# Patient Record
Sex: Male | Born: 1960 | Race: White | Hispanic: No | Marital: Single | Smoking: Former smoker
Health system: Southern US, Community
[De-identification: ages and names within clinical notes are randomized; demographics above are authoritative.]

## PROBLEM LIST (undated history)

## (undated) DIAGNOSIS — K219 Gastro-esophageal reflux disease without esophagitis: Secondary | ICD-10-CM

## (undated) DIAGNOSIS — I10 Essential (primary) hypertension: Secondary | ICD-10-CM

---

## 1997-08-22 ENCOUNTER — Emergency Department (HOSPITAL_COMMUNITY): Admission: EM | Admit: 1997-08-22 | Discharge: 1997-08-22 | Payer: Self-pay | Admitting: Emergency Medicine

## 1998-02-28 ENCOUNTER — Encounter: Payer: Self-pay | Admitting: Emergency Medicine

## 1998-02-28 ENCOUNTER — Emergency Department (HOSPITAL_COMMUNITY): Admission: EM | Admit: 1998-02-28 | Discharge: 1998-02-28 | Payer: Self-pay | Admitting: Emergency Medicine

## 1999-02-21 ENCOUNTER — Emergency Department (HOSPITAL_COMMUNITY): Admission: EM | Admit: 1999-02-21 | Discharge: 1999-02-21 | Payer: Self-pay | Admitting: Emergency Medicine

## 1999-11-25 ENCOUNTER — Emergency Department (HOSPITAL_COMMUNITY): Admission: EM | Admit: 1999-11-25 | Discharge: 1999-11-25 | Payer: Self-pay | Admitting: *Deleted

## 1999-11-25 ENCOUNTER — Encounter: Payer: Self-pay | Admitting: *Deleted

## 1999-12-27 ENCOUNTER — Encounter: Payer: Self-pay | Admitting: *Deleted

## 1999-12-27 ENCOUNTER — Emergency Department (HOSPITAL_COMMUNITY): Admission: EM | Admit: 1999-12-27 | Discharge: 1999-12-27 | Payer: Self-pay

## 1999-12-29 ENCOUNTER — Ambulatory Visit (HOSPITAL_BASED_OUTPATIENT_CLINIC_OR_DEPARTMENT_OTHER): Admission: RE | Admit: 1999-12-29 | Discharge: 1999-12-29 | Payer: Self-pay | Admitting: Orthopedic Surgery

## 2000-01-13 ENCOUNTER — Encounter: Admission: RE | Admit: 2000-01-13 | Discharge: 2000-04-12 | Payer: Self-pay | Admitting: Orthopedic Surgery

## 2000-03-02 ENCOUNTER — Emergency Department (HOSPITAL_COMMUNITY): Admission: EM | Admit: 2000-03-02 | Discharge: 2000-03-02 | Payer: Self-pay | Admitting: Emergency Medicine

## 2000-04-14 ENCOUNTER — Emergency Department (HOSPITAL_COMMUNITY): Admission: EM | Admit: 2000-04-14 | Discharge: 2000-04-14 | Payer: Self-pay | Admitting: Emergency Medicine

## 2000-04-14 ENCOUNTER — Encounter: Payer: Self-pay | Admitting: Emergency Medicine

## 2015-06-06 ENCOUNTER — Emergency Department (HOSPITAL_COMMUNITY): Payer: No Typology Code available for payment source | Admitting: Anesthesiology

## 2015-06-06 ENCOUNTER — Emergency Department (HOSPITAL_COMMUNITY): Payer: Self-pay | Admitting: Anesthesiology

## 2015-06-06 ENCOUNTER — Emergency Department (HOSPITAL_COMMUNITY): Payer: Self-pay

## 2015-06-06 ENCOUNTER — Inpatient Hospital Stay (HOSPITAL_COMMUNITY)
Admission: EM | Admit: 2015-06-06 | Discharge: 2015-06-09 | DRG: 494 | Disposition: A | Discharge status: Home / Self Care | Payer: Self-pay | Attending: Orthopedic Surgery | Admitting: Orthopedic Surgery

## 2015-06-06 ENCOUNTER — Encounter (HOSPITAL_COMMUNITY): Payer: Self-pay | Admitting: Emergency Medicine

## 2015-06-06 ENCOUNTER — Encounter (HOSPITAL_COMMUNITY)
Admission: EM | Disposition: A | Discharge status: Home / Self Care | Payer: Self-pay | Source: Home / Self Care | Attending: Orthopedic Surgery

## 2015-06-06 DIAGNOSIS — I10 Essential (primary) hypertension: Secondary | ICD-10-CM | POA: Diagnosis present

## 2015-06-06 DIAGNOSIS — Z09 Encounter for follow-up examination after completed treatment for conditions other than malignant neoplasm: Secondary | ICD-10-CM

## 2015-06-06 DIAGNOSIS — S82241A Displaced spiral fracture of shaft of right tibia, initial encounter for closed fracture: Principal | ICD-10-CM | POA: Diagnosis present

## 2015-06-06 DIAGNOSIS — S82291A Other fracture of shaft of right tibia, initial encounter for closed fracture: Secondary | ICD-10-CM

## 2015-06-06 DIAGNOSIS — T07XXXA Unspecified multiple injuries, initial encounter: Secondary | ICD-10-CM

## 2015-06-06 DIAGNOSIS — Z79899 Other long term (current) drug therapy: Secondary | ICD-10-CM

## 2015-06-06 DIAGNOSIS — S82201A Unspecified fracture of shaft of right tibia, initial encounter for closed fracture: Secondary | ICD-10-CM | POA: Diagnosis present

## 2015-06-06 DIAGNOSIS — N400 Enlarged prostate without lower urinary tract symptoms: Secondary | ICD-10-CM | POA: Diagnosis present

## 2015-06-06 DIAGNOSIS — Z419 Encounter for procedure for purposes other than remedying health state, unspecified: Secondary | ICD-10-CM

## 2015-06-06 DIAGNOSIS — S82401A Unspecified fracture of shaft of right fibula, initial encounter for closed fracture: Secondary | ICD-10-CM | POA: Diagnosis present

## 2015-06-06 DIAGNOSIS — Z888 Allergy status to other drugs, medicaments and biological substances status: Secondary | ICD-10-CM

## 2015-06-06 HISTORY — DX: Essential (primary) hypertension: I10

## 2015-06-06 HISTORY — PX: TIBIA IM NAIL INSERTION: SHX2516

## 2015-06-06 SURGERY — INSERTION, INTRAMEDULLARY ROD, TIBIA
Anesthesia: General | Laterality: Right

## 2015-06-06 MED ORDER — MORPHINE SULFATE (PF) 4 MG/ML IV SOLN
4.0000 mg | Freq: Once | INTRAVENOUS | Status: AC
  Administered 2015-06-06: 4 mg via INTRAVENOUS
  Filled 2015-06-06: qty 1

## 2015-06-06 MED ORDER — NEOSTIGMINE METHYLSULFATE 10 MG/10ML IV SOLN
INTRAVENOUS | Status: DC | PRN
  Administered 2015-06-06: 3 mg via INTRAVENOUS

## 2015-06-06 MED ORDER — ROCURONIUM BROMIDE 50 MG/5ML IV SOLN
INTRAVENOUS | Status: AC
  Filled 2015-06-06: qty 1

## 2015-06-06 MED ORDER — PROPOFOL 10 MG/ML IV BOLUS
INTRAVENOUS | Status: AC
  Filled 2015-06-06: qty 40

## 2015-06-06 MED ORDER — CEFAZOLIN SODIUM 1 G IJ SOLR
INTRAMUSCULAR | Status: AC
  Filled 2015-06-06: qty 20

## 2015-06-06 MED ORDER — PROPOFOL 10 MG/ML IV BOLUS
INTRAVENOUS | Status: DC | PRN
  Administered 2015-06-06: 200 mg via INTRAVENOUS

## 2015-06-06 MED ORDER — HYDROMORPHONE HCL 1 MG/ML IJ SOLN
1.0000 mg | Freq: Once | INTRAMUSCULAR | Status: AC
  Administered 2015-06-06: 1 mg via INTRAVENOUS
  Filled 2015-06-06: qty 1

## 2015-06-06 MED ORDER — PHENYLEPHRINE 40 MCG/ML (10ML) SYRINGE FOR IV PUSH (FOR BLOOD PRESSURE SUPPORT)
PREFILLED_SYRINGE | INTRAVENOUS | Status: AC
  Filled 2015-06-06: qty 10

## 2015-06-06 MED ORDER — EPHEDRINE SULFATE 50 MG/ML IJ SOLN
INTRAMUSCULAR | Status: DC | PRN
  Administered 2015-06-06: 10 mg via INTRAVENOUS

## 2015-06-06 MED ORDER — HYDROMORPHONE HCL 1 MG/ML IJ SOLN
INTRAMUSCULAR | Status: AC
  Filled 2015-06-06: qty 1

## 2015-06-06 MED ORDER — GLYCOPYRROLATE 0.2 MG/ML IV SOSY
PREFILLED_SYRINGE | INTRAVENOUS | Status: AC
  Filled 2015-06-06: qty 6

## 2015-06-06 MED ORDER — SUCCINYLCHOLINE CHLORIDE 200 MG/10ML IV SOSY
PREFILLED_SYRINGE | INTRAVENOUS | Status: AC
  Filled 2015-06-06: qty 10

## 2015-06-06 MED ORDER — DIPHENHYDRAMINE HCL 50 MG/ML IJ SOLN
INTRAMUSCULAR | Status: AC
  Filled 2015-06-06: qty 1

## 2015-06-06 MED ORDER — EPHEDRINE 5 MG/ML INJ
INTRAVENOUS | Status: AC
  Filled 2015-06-06: qty 10

## 2015-06-06 MED ORDER — MIDAZOLAM HCL 5 MG/5ML IJ SOLN
INTRAMUSCULAR | Status: DC | PRN
  Administered 2015-06-06: 2 mg via INTRAVENOUS

## 2015-06-06 MED ORDER — 0.9 % SODIUM CHLORIDE (POUR BTL) OPTIME
TOPICAL | Status: DC | PRN
  Administered 2015-06-06 (×3): 1000 mL

## 2015-06-06 MED ORDER — LIDOCAINE 2% (20 MG/ML) 5 ML SYRINGE
INTRAMUSCULAR | Status: AC
  Filled 2015-06-06: qty 5

## 2015-06-06 MED ORDER — ROCURONIUM BROMIDE 100 MG/10ML IV SOLN
INTRAVENOUS | Status: DC | PRN
  Administered 2015-06-06: 10 mg via INTRAVENOUS
  Administered 2015-06-06: 20 mg via INTRAVENOUS

## 2015-06-06 MED ORDER — NEOSTIGMINE METHYLSULFATE 5 MG/5ML IV SOSY
PREFILLED_SYRINGE | INTRAVENOUS | Status: AC
  Filled 2015-06-06: qty 5

## 2015-06-06 MED ORDER — GLYCOPYRROLATE 0.2 MG/ML IJ SOLN
INTRAMUSCULAR | Status: DC | PRN
  Administered 2015-06-06: 0.2 mg via INTRAVENOUS

## 2015-06-06 MED ORDER — TETANUS-DIPHTH-ACELL PERTUSSIS 5-2.5-18.5 LF-MCG/0.5 IM SUSP
0.5000 mL | Freq: Once | INTRAMUSCULAR | Status: AC
  Administered 2015-06-06: 0.5 mL via INTRAMUSCULAR
  Filled 2015-06-06: qty 0.5

## 2015-06-06 MED ORDER — FENTANYL CITRATE (PF) 250 MCG/5ML IJ SOLN
INTRAMUSCULAR | Status: DC | PRN
  Administered 2015-06-06 (×3): 50 ug via INTRAVENOUS
  Administered 2015-06-06: 100 ug via INTRAVENOUS

## 2015-06-06 MED ORDER — PHENYLEPHRINE HCL 10 MG/ML IJ SOLN
INTRAMUSCULAR | Status: DC | PRN
  Administered 2015-06-06: 80 ug via INTRAVENOUS

## 2015-06-06 MED ORDER — SUCCINYLCHOLINE CHLORIDE 20 MG/ML IJ SOLN
INTRAMUSCULAR | Status: DC | PRN
Start: 2015-06-06 — End: 2015-06-07
  Administered 2015-06-06: 100 mg via INTRAVENOUS

## 2015-06-06 MED ORDER — LACTATED RINGERS IV SOLN
INTRAVENOUS | Status: DC | PRN
  Administered 2015-06-06 (×2): via INTRAVENOUS

## 2015-06-06 MED ORDER — ETOMIDATE 2 MG/ML IV SOLN
INTRAVENOUS | Status: AC
  Filled 2015-06-06: qty 10

## 2015-06-06 MED ORDER — ONDANSETRON HCL 4 MG/2ML IJ SOLN
INTRAMUSCULAR | Status: DC | PRN
  Administered 2015-06-06: 4 mg via INTRAVENOUS

## 2015-06-06 MED ORDER — MIDAZOLAM HCL 2 MG/2ML IJ SOLN
INTRAMUSCULAR | Status: AC
  Filled 2015-06-06: qty 2

## 2015-06-06 MED ORDER — LIDOCAINE HCL (CARDIAC) 20 MG/ML IV SOLN
INTRAVENOUS | Status: DC | PRN
  Administered 2015-06-06: 50 mg via INTRATRACHEAL

## 2015-06-06 MED ORDER — BUPIVACAINE-EPINEPHRINE (PF) 0.5% -1:200000 IJ SOLN
INTRAMUSCULAR | Status: AC
  Filled 2015-06-06: qty 30

## 2015-06-06 MED ORDER — CEFAZOLIN SODIUM-DEXTROSE 2-3 GM-% IV SOLR
INTRAVENOUS | Status: DC | PRN
  Administered 2015-06-06: 2 g via INTRAVENOUS

## 2015-06-06 MED ORDER — FENTANYL CITRATE (PF) 250 MCG/5ML IJ SOLN
INTRAMUSCULAR | Status: AC
  Filled 2015-06-06: qty 5

## 2015-06-06 SURGICAL SUPPLY — 77 items
BANDAGE ELASTIC 4 VELCRO ST LF (GAUZE/BANDAGES/DRESSINGS) ×2 IMPLANT
BANDAGE ELASTIC 6 VELCRO ST LF (GAUZE/BANDAGES/DRESSINGS) ×2 IMPLANT
BIT DRILL 3.8X6 NS (BIT) ×2 IMPLANT
BIT DRILL 4.4 NS (BIT) ×2 IMPLANT
BLADE SURG 10 STRL SS (BLADE) ×4 IMPLANT
BNDG COHESIVE 4X5 TAN STRL (GAUZE/BANDAGES/DRESSINGS) ×2 IMPLANT
BNDG GAUZE ELAST 4 BULKY (GAUZE/BANDAGES/DRESSINGS) ×2 IMPLANT
COTTON STERILE ROLL (GAUZE/BANDAGES/DRESSINGS) ×2 IMPLANT
COVER MAYO STAND STRL (DRAPES) ×2 IMPLANT
COVER SURGICAL LIGHT HANDLE (MISCELLANEOUS) ×2 IMPLANT
CUFF TOURNIQUET SINGLE 34IN LL (TOURNIQUET CUFF) IMPLANT
CUFF TOURNIQUET SINGLE 44IN (TOURNIQUET CUFF) IMPLANT
DECANTER SPIKE VIAL GLASS SM (MISCELLANEOUS) ×2 IMPLANT
DRAPE C-ARM 42X72 X-RAY (DRAPES) ×2 IMPLANT
DRAPE C-ARMOR (DRAPES) ×2 IMPLANT
DRAPE IMP U-DRAPE 54X76 (DRAPES) ×2 IMPLANT
DRAPE INCISE IOBAN 66X45 STRL (DRAPES) IMPLANT
DRAPE ORTHO SPLIT 77X108 STRL (DRAPES) ×1
DRAPE PROXIMA HALF (DRAPES) ×2 IMPLANT
DRAPE SURG ORHT 6 SPLT 77X108 (DRAPES) ×1 IMPLANT
DRESSING OPSITE X SMALL 2X3 (GAUZE/BANDAGES/DRESSINGS) ×2 IMPLANT
DRSG PAD ABDOMINAL 8X10 ST (GAUZE/BANDAGES/DRESSINGS) ×2 IMPLANT
DURAPREP 26ML APPLICATOR (WOUND CARE) ×2 IMPLANT
ELECT REM PT RETURN 9FT ADLT (ELECTROSURGICAL) ×2
ELECTRODE REM PT RTRN 9FT ADLT (ELECTROSURGICAL) ×1 IMPLANT
GAUZE SPONGE 4X4 12PLY STRL (GAUZE/BANDAGES/DRESSINGS) ×2 IMPLANT
GAUZE XEROFORM 1X8 LF (GAUZE/BANDAGES/DRESSINGS) ×2 IMPLANT
GAUZE XEROFORM 5X9 LF (GAUZE/BANDAGES/DRESSINGS) ×2 IMPLANT
GLOVE BIO SURGEON STRL SZ8 (GLOVE) ×4 IMPLANT
GLOVE BIO SURGEON STRL SZ8.5 (GLOVE) ×6 IMPLANT
GLOVE BIOGEL PI IND STRL 8 (GLOVE) ×1 IMPLANT
GLOVE BIOGEL PI IND STRL 8.5 (GLOVE) ×2 IMPLANT
GLOVE BIOGEL PI INDICATOR 8 (GLOVE) ×1
GLOVE BIOGEL PI INDICATOR 8.5 (GLOVE) ×2
GLOVE ORTHO TXT STRL SZ7.5 (GLOVE) ×2 IMPLANT
GOWN STRL REUS W/ TWL LRG LVL3 (GOWN DISPOSABLE) ×1 IMPLANT
GOWN STRL REUS W/ TWL XL LVL3 (GOWN DISPOSABLE) ×4 IMPLANT
GOWN STRL REUS W/TWL LRG LVL3 (GOWN DISPOSABLE) ×1
GOWN STRL REUS W/TWL XL LVL3 (GOWN DISPOSABLE) ×4
GUIDEWIRE BALL NOSE 80CM (WIRE) ×2 IMPLANT
IMMOBILIZER KNEE 22 (SOFTGOODS) ×2 IMPLANT
KIT BASIN OR (CUSTOM PROCEDURE TRAY) ×2 IMPLANT
KIT ROOM TURNOVER OR (KITS) ×2 IMPLANT
MANIFOLD NEPTUNE II (INSTRUMENTS) IMPLANT
NAIL TIBIAL 11MMX34.5CM (Nail) ×2 IMPLANT
NEEDLE 22X1 1/2 (OR ONLY) (NEEDLE) ×2 IMPLANT
NEEDLE HYPO 21X1.5 SAFETY (NEEDLE) ×2 IMPLANT
NS IRRIG 1000ML POUR BTL (IV SOLUTION) ×6 IMPLANT
PACK ORTHO EXTREMITY (CUSTOM PROCEDURE TRAY) ×2 IMPLANT
PACK UNIVERSAL I (CUSTOM PROCEDURE TRAY) ×2 IMPLANT
PAD ABD 8X10 STRL (GAUZE/BANDAGES/DRESSINGS) ×2 IMPLANT
PAD ARMBOARD 7.5X6 YLW CONV (MISCELLANEOUS) ×4 IMPLANT
PAD CAST 4YDX4 CTTN HI CHSV (CAST SUPPLIES) ×1 IMPLANT
PADDING CAST COTTON 4X4 STRL (CAST SUPPLIES) ×1
PIN GUIDE 3.2 903003004 (MISCELLANEOUS) ×2 IMPLANT
SCREW ACECAP 38MM (Screw) ×4 IMPLANT
SCREW ACECAP 40MM (Screw) ×4 IMPLANT
SCREW ACECAP 52MM (Screw) ×2 IMPLANT
SCREW CORTICAL 5.5 35MM (Screw) ×2 IMPLANT
SCREW PROXIMAL DEPUY (Screw) ×1 IMPLANT
SCREW PRXML FT 50X5.5XLCK NS (Screw) ×1 IMPLANT
SPONGE GAUZE 4X4 12PLY STER LF (GAUZE/BANDAGES/DRESSINGS) ×2 IMPLANT
SPONGE LAP 18X18 X RAY DECT (DISPOSABLE) ×2 IMPLANT
SPONGE LAP 4X18 X RAY DECT (DISPOSABLE) ×2 IMPLANT
STAPLER VISISTAT 35W (STAPLE) ×2 IMPLANT
STOCKINETTE IMPERVIOUS LG (DRAPES) ×2 IMPLANT
SUT VIC AB 0 CTB1 27 (SUTURE) ×2 IMPLANT
SUT VIC AB 1 CT1 27 (SUTURE) ×2
SUT VIC AB 1 CT1 27XBRD ANBCTR (SUTURE) ×2 IMPLANT
SUT VIC AB 2-0 CT1 27 (SUTURE) ×2
SUT VIC AB 2-0 CT1 TAPERPNT 27 (SUTURE) ×2 IMPLANT
SYR CONTROL 10ML LL (SYRINGE) ×2 IMPLANT
TOWEL OR 17X24 6PK STRL BLUE (TOWEL DISPOSABLE) ×2 IMPLANT
TOWEL OR 17X26 10 PK STRL BLUE (TOWEL DISPOSABLE) ×2 IMPLANT
TUBE CONNECTING 12X1/4 (SUCTIONS) ×2 IMPLANT
WATER STERILE IRR 1000ML POUR (IV SOLUTION) ×2 IMPLANT
YANKAUER SUCT BULB TIP NO VENT (SUCTIONS) ×2 IMPLANT
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
## 2015-06-06 NOTE — ED Notes (Signed)
Ortho at bedside.

## 2015-06-06 NOTE — Anesthesia Procedure Notes (Signed)
Procedure Name: Intubation Date/Time: 06/06/2015 9:52 PM Performed by: Brien MatesMAHONY, Meena Barrantes D Pre-anesthesia Checklist: Patient identified, Emergency Drugs available, Suction available, Patient being monitored and Timeout performed Patient Re-evaluated:Patient Re-evaluated prior to inductionOxygen Delivery Method: Circle system utilized Preoxygenation: Pre-oxygenation with 100% oxygen Intubation Type: IV induction, Rapid sequence and Cricoid Pressure applied Laryngoscope Size: Miller and 2 Grade View: Grade I Tube type: Oral Tube size: 7.5 mm Number of attempts: 1 Airway Equipment and Method: Stylet Placement Confirmation: ETT inserted through vocal cords under direct vision,  positive ETCO2 and breath sounds checked- equal and bilateral Secured at: 22 cm Tube secured with: Tape Dental Injury: Teeth and Oropharynx as per pre-operative assessment

## 2015-06-06 NOTE — Anesthesia Preprocedure Evaluation (Addendum)
Anesthesia Evaluation  Patient identified by MRN, date of birth, ID band Patient awake    Reviewed: Allergy & Precautions, NPO status , Patient's Chart, lab work & pertinent test results  Airway Mallampati: I  TM Distance: >3 FB Neck ROM: Full    Dental  (+) Poor Dentition, Dental Advisory Given   Pulmonary    Pulmonary exam normal        Cardiovascular hypertension, Normal cardiovascular exam     Neuro/Psych    GI/Hepatic   Endo/Other    Renal/GU      Musculoskeletal   Abdominal   Peds  Hematology   Anesthesia Other Findings   Reproductive/Obstetrics                            Anesthesia Physical Anesthesia Plan  ASA: I and emergent  Anesthesia Plan: General   Post-op Pain Management:    Induction: Intravenous  Airway Management Planned: Oral ETT  Additional Equipment:   Intra-op Plan:   Post-operative Plan: Extubation in OR  Informed Consent: I have reviewed the patients History and Physical, chart, labs and discussed the procedure including the risks, benefits and alternatives for the proposed anesthesia with the patient or authorized representative who has indicated his/her understanding and acceptance.   Dental advisory given  Plan Discussed with: CRNA, Anesthesiologist and Surgeon  Anesthesia Plan Comments:        Anesthesia Quick Evaluation

## 2015-06-06 NOTE — Progress Notes (Signed)
Orthopedic Tech Progress Note Patient Details:  Ryan Finley [birth year] [id number]  Ortho Devices Type of Ortho Device: Ace wrap, Post (long leg) splint, Stirrup splint Ortho Device/Splint Location: RLE Ortho Device/Splint Interventions: Ordered, Application   Jennye MoccasinHughes, [name] Craig 06/06/2015, 7:29 PM

## 2015-06-06 NOTE — Brief Op Note (Signed)
06/06/2015  11:36 PM  PATIENT:  Clide CliffJohn M Skilling  55 y.o. male  PRE-OPERATIVE DIAGNOSIS:  RIGHT TIBIA FRACTURE  POST-OPERATIVE DIAGNOSIS:  RIGHT TIBIA FRACTURE  PROCEDURE:  Procedure(s): INTRAMEDULLARY (IM) NAIL TIBIAL (Right)  SURGEON:  Surgeon(s) and Role:    * Samson FredericBrian Candice Lunney, MD - Primary  PHYSICIAN ASSISTANT: Alphonsa OverallBrad Dixon, PA-C  ASSISTANTS: staff   ANESTHESIA:   general  EBL:  Total I/O In: 1700 [I.V.:1700] Out: 150 [Blood:150]  BLOOD ADMINISTERED:none  DRAINS: none   LOCAL MEDICATIONS USED:  NONE  SPECIMEN:  No Specimen  DISPOSITION OF SPECIMEN:  N/A  COUNTS:  YES  TOURNIQUET:  * No tourniquets in log *  DICTATION: .Other Dictation: Dictation Number [id number]  PLAN OF CARE: Admit to inpatient   PATIENT DISPOSITION:  PACU - hemodynamically stable.   Delay start of Pharmacological VTE agent (>24hrs) due to surgical blood loss or risk of bleeding: no

## 2015-06-07 ENCOUNTER — Inpatient Hospital Stay (HOSPITAL_COMMUNITY): Payer: No Typology Code available for payment source

## 2015-06-07 DIAGNOSIS — S82201A Unspecified fracture of shaft of right tibia, initial encounter for closed fracture: Secondary | ICD-10-CM | POA: Diagnosis present

## 2015-06-07 LAB — CBC
HCT: 38 % — ABNORMAL LOW (ref 39.0–52.0)
HEMOGLOBIN: 13.2 g/dL (ref 13.0–17.0)
MCH: 30.1 pg (ref 26.0–34.0)
MCHC: 34.7 g/dL (ref 30.0–36.0)
MCV: 86.6 fL (ref 78.0–100.0)
PLATELETS: 206 10*3/uL (ref 150–400)
RBC: 4.39 MIL/uL (ref 4.22–5.81)
RDW: 12.8 % (ref 11.5–15.5)
WBC: 13.6 10*3/uL — ABNORMAL HIGH (ref 4.0–10.5)

## 2015-06-07 LAB — CREATININE, SERUM
CREATININE: 1.04 mg/dL (ref 0.61–1.24)
GFR calc Af Amer: >60 mL/min (ref >60)
GFR calc non Af Amer: >60 mL/min (ref >60)

## 2015-06-07 MED ORDER — ONDANSETRON HCL 4 MG/2ML IJ SOLN
4.0000 mg | Freq: Four times a day (QID) | INTRAMUSCULAR | Status: DC | PRN
  Administered 2015-06-07 – 2015-06-08 (×2): 4 mg via INTRAVENOUS
  Filled 2015-06-07 (×2): qty 2

## 2015-06-07 MED ORDER — HYDROMORPHONE HCL 1 MG/ML IJ SOLN
INTRAMUSCULAR | Status: AC
  Filled 2015-06-07: qty 1

## 2015-06-07 MED ORDER — HYDROMORPHONE HCL 1 MG/ML IJ SOLN
0.5000 mg | INTRAMUSCULAR | Status: DC | PRN
  Administered 2015-06-07: 1 mg via INTRAVENOUS
  Administered 2015-06-07: 2 mg via INTRAVENOUS
  Administered 2015-06-07: 1 mg via INTRAVENOUS
  Administered 2015-06-07 (×2): 2 mg via INTRAVENOUS
  Administered 2015-06-07: 1 mg via INTRAVENOUS
  Administered 2015-06-07 (×2): 2 mg via INTRAVENOUS
  Administered 2015-06-07: 1 mg via INTRAVENOUS
  Administered 2015-06-07 – 2015-06-08 (×5): 2 mg via INTRAVENOUS
  Filled 2015-06-07: qty 1
  Filled 2015-06-07 (×5): qty 2
  Filled 2015-06-07: qty 1
  Filled 2015-06-07: qty 2
  Filled 2015-06-07: qty 1
  Filled 2015-06-07: qty 2
  Filled 2015-06-07: qty 1
  Filled 2015-06-07 (×3): qty 2

## 2015-06-07 MED ORDER — METHOCARBAMOL 500 MG PO TABS
500.0000 mg | ORAL_TABLET | Freq: Three times a day (TID) | ORAL | Status: DC | PRN
  Administered 2015-06-07 – 2015-06-09 (×6): 500 mg via ORAL
  Filled 2015-06-07 (×6): qty 1

## 2015-06-07 MED ORDER — OXYCODONE HCL 5 MG PO TABS
5.0000 mg | ORAL_TABLET | ORAL | Status: DC | PRN
  Administered 2015-06-07 – 2015-06-08 (×4): 10 mg via ORAL
  Filled 2015-06-07 (×5): qty 2

## 2015-06-07 MED ORDER — ENOXAPARIN SODIUM 40 MG/0.4ML ~~LOC~~ SOLN
40.0000 mg | SUBCUTANEOUS | Status: DC
  Administered 2015-06-07 – 2015-06-09 (×3): 40 mg via SUBCUTANEOUS
  Filled 2015-06-07 (×3): qty 0.4

## 2015-06-07 MED ORDER — ONDANSETRON HCL 4 MG/2ML IJ SOLN
4.0000 mg | Freq: Once | INTRAMUSCULAR | Status: AC | PRN
  Administered 2015-06-07: 4 mg via INTRAVENOUS

## 2015-06-07 MED ORDER — METOCLOPRAMIDE HCL 5 MG PO TABS
5.0000 mg | ORAL_TABLET | Freq: Three times a day (TID) | ORAL | Status: DC | PRN
Start: 2015-06-07 — End: 2015-06-09

## 2015-06-07 MED ORDER — DOCUSATE SODIUM 100 MG PO CAPS
100.0000 mg | ORAL_CAPSULE | Freq: Two times a day (BID) | ORAL | Status: DC
  Administered 2015-06-07 – 2015-06-09 (×5): 100 mg via ORAL
  Filled 2015-06-07 (×5): qty 1

## 2015-06-07 MED ORDER — ONDANSETRON HCL 4 MG/2ML IJ SOLN
INTRAMUSCULAR | Status: AC
  Filled 2015-06-07: qty 2

## 2015-06-07 MED ORDER — ACETAMINOPHEN 500 MG PO TABS
1000.0000 mg | ORAL_TABLET | Freq: Four times a day (QID) | ORAL | Status: AC
  Administered 2015-06-07 – 2015-06-08 (×4): 1000 mg via ORAL
  Filled 2015-06-07 (×5): qty 2

## 2015-06-07 MED ORDER — ACETAMINOPHEN 325 MG PO TABS
650.0000 mg | ORAL_TABLET | Freq: Four times a day (QID) | ORAL | Status: DC | PRN
  Filled 2015-06-07 (×2): qty 2

## 2015-06-07 MED ORDER — MAGNESIUM CITRATE PO SOLN
1.0000 | Freq: Once | ORAL | Status: AC | PRN
  Administered 2015-06-09: 1 via ORAL
  Filled 2015-06-07: qty 296

## 2015-06-07 MED ORDER — HYDROMORPHONE HCL 1 MG/ML IJ SOLN
0.5000 mg | INTRAMUSCULAR | Status: AC | PRN
  Administered 2015-06-07 (×4): 0.5 mg via INTRAVENOUS

## 2015-06-07 MED ORDER — SENNA 8.6 MG PO TABS
1.0000 | ORAL_TABLET | Freq: Two times a day (BID) | ORAL | Status: DC
  Administered 2015-06-07 – 2015-06-09 (×5): 8.6 mg via ORAL
  Filled 2015-06-07 (×5): qty 1

## 2015-06-07 MED ORDER — LORAZEPAM 0.5 MG PO TABS
0.5000 mg | ORAL_TABLET | Freq: Once | ORAL | Status: AC | PRN
  Administered 2015-06-07: 0.5 mg via ORAL
  Filled 2015-06-07: qty 1

## 2015-06-07 MED ORDER — PANTOPRAZOLE SODIUM 40 MG PO TBEC
40.0000 mg | DELAYED_RELEASE_TABLET | Freq: Every day | ORAL | Status: DC
  Administered 2015-06-07 – 2015-06-09 (×3): 40 mg via ORAL
  Filled 2015-06-07 (×3): qty 1

## 2015-06-07 MED ORDER — ONDANSETRON HCL 4 MG PO TABS: 4.0000 mg | ORAL_TABLET | Freq: Four times a day (QID) | ORAL | Status: DC | PRN

## 2015-06-07 MED ORDER — SODIUM CHLORIDE 0.9 % IV SOLN
INTRAVENOUS | Status: DC
  Administered 2015-06-07 – 2015-06-08 (×3): via INTRAVENOUS

## 2015-06-07 MED ORDER — HYDROMORPHONE HCL 1 MG/ML IJ SOLN
0.2500 mg | INTRAMUSCULAR | Status: DC | PRN
  Administered 2015-06-07 (×2): 0.5 mg via INTRAVENOUS

## 2015-06-07 MED ORDER — CEFAZOLIN SODIUM-DEXTROSE 2-4 GM/100ML-% IV SOLN
2.0000 g | Freq: Four times a day (QID) | INTRAVENOUS | Status: AC
  Administered 2015-06-07 (×3): 2 g via INTRAVENOUS
  Filled 2015-06-07 (×3): qty 100

## 2015-06-07 MED ORDER — SORBITOL 70 % SOLN
30.0000 mL | Freq: Every day | Status: DC | PRN
  Administered 2015-06-09: 30 mL via ORAL
  Filled 2015-06-07: qty 30

## 2015-06-07 MED ORDER — TERAZOSIN HCL 5 MG PO CAPS
5.0000 mg | ORAL_CAPSULE | Freq: Every day | ORAL | Status: DC
  Administered 2015-06-07 – 2015-06-08 (×2): 5 mg via ORAL
  Filled 2015-06-07 (×2): qty 1

## 2015-06-07 MED ORDER — METOCLOPRAMIDE HCL 5 MG/ML IJ SOLN
5.0000 mg | Freq: Three times a day (TID) | INTRAMUSCULAR | Status: DC | PRN
  Administered 2015-06-08: 10 mg via INTRAVENOUS
  Filled 2015-06-07: qty 2

## 2015-06-07 MED ORDER — ACETAMINOPHEN 650 MG RE SUPP: 650.0000 mg | Freq: Four times a day (QID) | RECTAL | Status: DC | PRN

## 2015-06-07 NOTE — Progress Notes (Signed)
Patient ID: Ryan Finley, male   DOB: 03/13/1960, 55 y.o.   MRN: [id number]    Subjective: 1 Day Post-Op Procedure(s) (LRB): INTRAMEDULLARY (IM) NAIL TIBIAL (Right) Patient reports pain as 7 on 0-10 scale.   Denies CP or SOB.  Pt is not voiding yet. Positive flatus. Pt eating, no nausea Objective: Vital signs in last 24 hours: Temp:  [97.4 F (36.3 C)-98.6 F (37 C)] 98.6 F (37 C) (05/14 0825) Pulse Rate:  [77-103] 103 (05/14 0825) Resp:  [11-22] 14 (05/14 0825) BP: (125-172)/(51-102) 149/80 mmHg (05/14 0825) SpO2:  [90 %-100 %] 95 % (05/14 0825) Weight:  [86.183 kg (190 lb)] 86.183 kg (190 lb) (05/13 1828)  Intake/Output from previous day: 05/13 0701 - 05/14 0700 In: 2060 [P.O.:360; I.V.:1700] Out: 500 [Urine:350; Blood:150] Intake/Output this shift: Total I/O In: 240 [P.O.:240] Out: -   Labs:  Recent Labs  06/07/15 0100  HGB 13.2    Recent Labs  06/07/15 0100  WBC 13.6*  RBC 4.39  HCT 38.0*  PLT 206    Recent Labs  06/07/15 0100  CREATININE 1.04   No results for input(s): LABPT, INR in the last 72 hours.  Physical Exam: Neurologically intact ABD soft Neurovascular intact Incision: dressing C/D/I Compartment soft Pt moving toes b/l Flexion of 1st metatarsal does not elicit pain  Assessment/Plan: 1 Day Post-Op Procedure(s) (LRB): INTRAMEDULLARY (IM) NAIL TIBIAL (Right) Advance diet Up with therapy  Non weight bearing of right lower extremity Bladder scan and in/out cath may be needed again  Kirt BoysMayo, Taedyn Glasscock Christina for Dr. Venita Lickahari Brooks [facility] [phone] 06/07/2015, 8:35 AM

## 2015-06-07 NOTE — Transfer of Care (Signed)
Immediate Anesthesia Transfer of Care Note  Patient: Ryan Finley  Procedure(s) Performed: Procedure(s): INTRAMEDULLARY (IM) NAIL TIBIAL (Right)  Patient Location: PACU  Anesthesia Type:General  Level of Consciousness: awake  Airway & Oxygen Therapy: Patient Spontanous Breathing  Post-op Assessment: Report given to RN and Post -op Vital signs reviewed and stable  Post vital signs: Reviewed and stable  Last Vitals:  Filed Vitals:   06/06/15 1930 06/06/15 2000  BP: 146/94 148/102  Pulse: 87 88  Temp:    Resp: 16 16    Last Pain:  Filed Vitals:   06/06/15 2134  PainSc: 8          Complications: No apparent anesthesia complications

## 2015-06-07 NOTE — Progress Notes (Signed)
PT Cancellation Note  Patient Details Name: Ryan CliffJohn M Archambault MRN: [id number] DOB: [birth year]   Cancelled Treatment:    Reason Eval/Treat Not Completed: Pain limiting ability to participate - pt unable to urinate, feels extreme pressure/discomfort at bladder and declines mobility evaluation at this time.  Reports up to EOB earlier this date. Will reattempt evaluation and make recommendations when pt willing to participate.     Narda AmberMartin, Brinleigh Tew [facility] 06/07/2015, 2:10 PM

## 2015-06-07 NOTE — Progress Notes (Signed)
D; unable to Void, Bladder scan done, 400 ml , notified MD, and I&O cath order received. I&O cath done, cloudy ambercolored urin, obtained

## 2015-06-07 NOTE — Anesthesia Postprocedure Evaluation (Signed)
Anesthesia Post Note  Patient: Ryan Finley  Procedure(s) Performed: Procedure(s) (LRB): INTRAMEDULLARY (IM) NAIL TIBIAL (Right)  Patient location during evaluation: PACU Level of consciousness: awake, oriented and patient cooperative Pain management: pain level not controlled Vital Signs Assessment: post-procedure vital signs reviewed and stable Respiratory status: spontaneous breathing and respiratory function stable Cardiovascular status: stable Anesthetic complications: no    Last Vitals:  Filed Vitals:   06/06/15 1930 06/06/15 2000  BP: 146/94 148/102  Pulse: 87 88  Temp:    Resp: 16 16    Last Pain:  Filed Vitals:   06/06/15 2134  PainSc: 8                  Kyden Potash EDWARD
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
## 2015-06-08 ENCOUNTER — Encounter (HOSPITAL_COMMUNITY): Payer: Self-pay | Admitting: Orthopedic Surgery

## 2015-06-08 MED ORDER — OXYCODONE HCL 5 MG PO TABS
10.0000 mg | ORAL_TABLET | ORAL | Status: DC | PRN
  Administered 2015-06-08 – 2015-06-09 (×8): 15 mg via ORAL
  Filled 2015-06-08 (×8): qty 3

## 2015-06-08 NOTE — Progress Notes (Signed)
   Subjective:  Patient reports pain as moderate.  BPH meds restarted - able to void.  Objective:   VITALS:   Filed Vitals:   06/07/15 0825 06/07/15 1538 06/07/15 2245 06/08/15 0455  BP: 149/80 139/88 128/83 134/77  Pulse: 103 103 103 104  Temp: 98.6 F (37 C) 99 F (37.2 C) 98.1 F (36.7 C) 98.3 F (36.8 C)  TempSrc: Oral Oral Oral Oral  Resp: 14 16 16 16   Height:      Weight:      SpO2: 95% 96% 91% 94%    ABD soft Intact pulses distally Dorsiflexion/Plantar flexion intact Incision: dressing C/D/I Compartment soft No pain with passive stretch No evidence of impending compartment syndrome  Lab Results  Component Value Date   WBC 13.6* 06/07/2015   HGB 13.2 06/07/2015   HCT 38.0* 06/07/2015   MCV 86.6 06/07/2015   PLT 206 06/07/2015   BMET    Component Value Date/Time   CREATININE 1.04 06/07/2015 0100   GFRNONAA >60 06/07/2015 0100   GFRAA >60 06/07/2015 0100     Assessment/Plan: 2 Days Post-Op   Active Problems:   Closed right tibial fracture  NWB RLE DVT ppx: lovenox, SCDs, TEDs PO pain control PT/OT D/C planning   Elvena Oyer, Cloyde ReamsBrian James 06/08/2015, 7:55 AM   Samson FredericBrian Anel Purohit, MD Cell [phone]
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
## 2015-06-08 NOTE — Progress Notes (Signed)
OT Cancellation Note  Patient Details Name: Ryan Finley MRN: [id number] DOB: September 07, 1960   Cancelled Treatment:    Reason Eval/Treat Not Completed: Other (comment). In to see pt for OT evaluation. He reports he would like at least two hours of good sleep. That he got up earlier this morning with PT and then got back to bed by himself and since both of these his knee has been throbbing and has just now calmed down as well as he said he threw up earlier while was in recliner. He states he feels like he is being pushed to do things to soon. Made him aware we may or may not be able to see him later today and if not would see him tomorrow--he verbalized understanding.  Ryan GeorgesLeonard, Ryan Finley Ryan Finley [phone] 06/08/2015, 11:43 AM

## 2015-06-09 MED ORDER — OXYCODONE HCL 5 MG PO TABS: 10.0000 mg | ORAL_TABLET | ORAL | Status: DC | PRN

## 2015-06-09 MED ORDER — ASPIRIN EC 325 MG PO TBEC: 325.0000 mg | DELAYED_RELEASE_TABLET | Freq: Two times a day (BID) | ORAL | Status: DC

## 2015-06-09 MED ORDER — SENNA 8.6 MG PO TABS: 2.0000 | ORAL_TABLET | Freq: Every day | ORAL | Status: DC

## 2015-06-09 MED ORDER — DOCUSATE SODIUM 100 MG PO CAPS: 100.0000 mg | ORAL_CAPSULE | Freq: Two times a day (BID) | ORAL | Status: DC

## 2015-06-09 MED ORDER — ONDANSETRON HCL 4 MG PO TABS: 4.0000 mg | ORAL_TABLET | Freq: Four times a day (QID) | ORAL | Status: DC | PRN
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
## 2015-06-09 NOTE — Discharge Instructions (Signed)
Non weight bearing right leg Elevate right ankle to level of your heart on blankets / pillows Leave dressing in place. Keep it clean and dry
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
## 2015-06-09 NOTE — Progress Notes (Signed)
   Subjective:  Patient reports pain as moderate.  Feeling better this am.  Objective:   VITALS:   Filed Vitals:   06/08/15 1245 06/08/15 1956 06/08/15 2200 06/09/15 0451  BP: 139/79 150/84  136/77  Pulse: 105 120 120 114  Temp: 98.9 F (37.2 C) 100 F (37.8 C)  98.7 F (37.1 C)  TempSrc: Oral Oral  Oral  Resp: 15 18  19   Height:      Weight:      SpO2: 90% 93%  96%    ABD soft Intact pulses distally Dorsiflexion/Plantar flexion intact Incision: dressing C/D/I Compartment soft No pain with passive stretch No evidence of impending compartment syndrome  Lab Results  Component Value Date   WBC 13.6* 06/07/2015   HGB 13.2 06/07/2015   HCT 38.0* 06/07/2015   MCV 86.6 06/07/2015   PLT 206 06/07/2015   BMET    Component Value Date/Time   CREATININE 1.04 06/07/2015 0100   GFRNONAA >60 06/07/2015 0100   GFRAA >60 06/07/2015 0100     Assessment/Plan: 3 Days Post-Op   Active Problems:   Closed right tibial fracture  NWB RLE DVT ppx: lovenox, SCDs, TEDs PO pain control PT/OT D/C planning   Sartaj Hoskin, Cloyde ReamsBrian James 06/09/2015, 7:55 AM   Samson FredericBrian Marchele Decock, MD Cell [phone]
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
## 2015-06-29 ENCOUNTER — Ambulatory Visit: Payer: Self-pay | Attending: Orthopedic Surgery | Admitting: Physical Therapy

## 2015-06-29 DIAGNOSIS — M25671 Stiffness of right ankle, not elsewhere classified: Secondary | ICD-10-CM | POA: Insufficient documentation

## 2015-06-29 DIAGNOSIS — M25571 Pain in right ankle and joints of right foot: Secondary | ICD-10-CM | POA: Insufficient documentation

## 2015-06-29 DIAGNOSIS — M25661 Stiffness of right knee, not elsewhere classified: Secondary | ICD-10-CM | POA: Insufficient documentation

## 2015-06-29 DIAGNOSIS — R609 Edema, unspecified: Secondary | ICD-10-CM | POA: Insufficient documentation

## 2015-06-29 DIAGNOSIS — M6281 Muscle weakness (generalized): Secondary | ICD-10-CM | POA: Insufficient documentation

## 2015-06-29 DIAGNOSIS — R2689 Other abnormalities of gait and mobility: Secondary | ICD-10-CM | POA: Insufficient documentation

## 2015-06-29 DIAGNOSIS — M25561 Pain in right knee: Secondary | ICD-10-CM | POA: Insufficient documentation
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
## 2015-06-29 NOTE — Patient Instructions (Signed)
AROM: Toe Curl    Sitting or lying with left heel supported, gently curl and straighten toes. Repeat __10__ times per set. Do __1__ sets per session. Do __2__ sessions per day.  http://orth.exer.us/60   Copyright  VHI. All rights reserved.    Ankle Pump    With left leg elevated, gently flex and extend ankle. Move through full range of motion. Avoid pain. Repeat __10__ times per set. Do _1___ sets per session. Do __3__ sessions per day.  http://orth.exer.us/32   Copyright  VHI. All rights reserved.   ROM: Inversion / Eversion    With left leg relaxed, gently turn ankle and foot in and out. Move through full range of motion. Avoid pain. Repeat _10___ times per set. Do __1__ sets per session. Do _3___ sessions per day.  http://orth.exer.us/36   Copyright  VHI. All rights reserved.   Strengthening: Straight Leg Raise (Phase 1)    Tighten muscles on front of right thigh, then lift leg ____ inches from surface, keeping knee locked.  Repeat __10__ times per set. Do _2___ sets per session. Do __3__ sessions per day.  http://orth.exer.us/614   Copyright  VHI. All rights reserved.    Strengthening: Hip Abduction (Side-Lying)    Tighten muscles on front of left thigh, then lift leg ____ inches from surface, keeping knee locked.  Repeat _10___ times per set. Do ___1_ sets per session. Do _3___ sessions per day.  http://orth.exer.us/622   Copyright  VHI. All rights reserved.    Strengthening: Quadriceps Set    Tighten muscles on top of thighs by pushing knees down into surface. Hold _5___ seconds. Repeat ___10_ times per set. Do __1__ sets per session. Do __2__ sessions per day.  http://orth.exer.us/602   Copyright  VHI. All rights reserved.  Self-Mobilization: Heel Slide (Supine)    Slide left heel toward buttocks until a gentle stretch is felt. Hold __5__ seconds. Relax. Repeat _10___ times per set. Do ___1_ sets per session. Do __3__ sessions per  day.  http://orth.exer.us/710   Copyright  VHI. All rights reserved.   [facility] [address], [address] [place], [place] [address] Phone # [phone] Fax [phone]

## 2015-06-30 ENCOUNTER — Telehealth: Payer: Self-pay | Admitting: Physical Therapy

## 2015-06-30 NOTE — Telephone Encounter (Signed)
MD faxed information on patient.  He is to be toe touch weight bearing on the right LE for the next 4 weeks.  PT should include ROM for ankle and knee.   Ryan FosterCheryl Marteze Finley, PT @6 /06/2015@ 3:27 PM

## 2015-07-02 ENCOUNTER — Ambulatory Visit: Payer: Self-pay | Admitting: Physical Therapy

## 2015-07-02 ENCOUNTER — Encounter: Payer: Self-pay | Admitting: Physical Therapy

## 2015-07-02 DIAGNOSIS — M25571 Pain in right ankle and joints of right foot: Secondary | ICD-10-CM

## 2015-07-02 DIAGNOSIS — M25671 Stiffness of right ankle, not elsewhere classified: Secondary | ICD-10-CM

## 2015-07-02 DIAGNOSIS — M25561 Pain in right knee: Secondary | ICD-10-CM

## 2015-07-02 DIAGNOSIS — M6281 Muscle weakness (generalized): Secondary | ICD-10-CM

## 2015-07-02 DIAGNOSIS — R609 Edema, unspecified: Secondary | ICD-10-CM

## 2015-07-02 DIAGNOSIS — R2689 Other abnormalities of gait and mobility: Secondary | ICD-10-CM

## 2015-07-02 DIAGNOSIS — M25661 Stiffness of right knee, not elsewhere classified: Secondary | ICD-10-CM

## 2015-07-02 NOTE — Therapy (Signed)
[facility] [address], [address] [place], [place], [address] Phone: [phone]   Fax:  [phone]  Physical Therapy Treatment  Patient Details  Name: Ryan Finley MRN: [id number] Date of Birth: [birth year] Referring Provider: Dr. Samson FredericBrian Swinteck  Encounter Date: 07/02/2015      PT End of Session - 07/02/15 1120    Visit Number 2   Date for PT Re-Evaluation 07/27/15   Authorization Type self pay   PT Start Time 1100   PT Stop Time 1201   PT Time Calculation (min) 61 min   Activity Tolerance Patient tolerated treatment well   Behavior [facility] Therapy [facility] for tasks assessed/performed      Past Medical History  Diagnosis Date  . Hypertension     Past Surgical History  Procedure Laterality Date  . Tibia im nail insertion Right 06/06/2015    Procedure: INTRAMEDULLARY (IM) NAIL TIBIAL;  Surgeon: Samson FredericBrian Swinteck, MD;  Location: [facility] OR;  Service: Orthopedics;  Laterality: Right;    There were no vitals filed for this visit.      Subjective Assessment - 07/02/15 1112    Subjective Pt reports compliance with initial HEP. Pt rates his pain as 5/10 premedicated, very tigth from knee cap down to ankle, very sensitive on med inferior ankle.    Patient Stated Goals be able to use right leg and increased mobility   Pain Score 5    Pain Location Leg   Pain Orientation Right   Pain Descriptors / Indicators Tightness;Tingling   Pain Type Acute pain   Aggravating Factors  when place toes on floor to keep balance   Pain Relieving Factors elevation of leg   Multiple Pain Sites No                         [facility] Adult PT Treatment/Exercise - 07/02/15 0001    Ambulation/Gait   Ambulation/Gait Yes   Assistive device Crutches   Ambulation Surface Level   Stairs Yes   Stair Management Technique Backwards;With crutches   Gait Comments practiced toe touch   Therapeutic Activites    Therapeutic Activities --  Pt  educated in positioning Rt LE supine,elevation & prone     Exercises   Exercises Knee/Hip   Knee/Hip Exercises: Seated   Heel Slides AAROM;Right;4 sets  x 1 min each set, foot placed on towel on board for sliding   Knee/Hip Exercises: Supine   Heel Slides AROM;3 sets  1min   Terminal Knee Extension AROM;2 sets  1 min   Knee/Hip Exercises: Sidelying   Hip ABduction AROM;Right;1 set  with modification due to limitations   Knee/Hip Exercises: Prone   Hamstring Curl 2 sets  1 min each    Hip Extension AROM;Right;2 sets  1 min    Prone Knee Hang 2 minutes  pillow under prox Rt knee   Modalities   Modalities Vasopneumatic   Vasopneumatic   Number Minutes Vasopneumatic  15 minutes   Vasopnuematic Location  Ankle  Rt   Vasopneumatic Pressure Medium   Vasopneumatic Temperature  3snowflakes                  PT Short Term Goals - 06/29/15 1020    PT SHORT TERM GOAL #1   Title full mobility of right patella making it easier to flex his right knee past 100 degrees actively   Time 4   Period Weeks   Status New  PT SHORT TERM GOAL #2   Title increased right ankle dorsiflexion >/= 0 degrees due to imporve mobility of right achilles tendon   Time 4   Period Weeks   Status New   PT SHORT TERM GOAL #3   Title pain improved >/= 50% due to improve tissue mobility   Time 4   Period Weeks   Status New   PT SHORT TERM GOAL #4   Title right knee extension increased >/= -5 degrees due to increased right patella mobility   Time 4   Period Weeks   Status New           PT Long Term Goals - 06/29/15 1132    PT LONG TERM GOAL #1   Title independent with HEP    Time 12   Period Weeks   Status New   PT LONG TERM GOAL #2   Title ambulate with no assistive device with minimal gait deficits and full weight on right leg   Time 12   Period Weeks   Status New   PT LONG TERM GOAL #3   Title right knee AROM >/= 115 degrees so he is able to go up and down stairs with a step  over step pattern   Time 12   Period Weeks   Status New   PT LONG TERM GOAL #4   Title Right knee and ankle strength >/= 4+/ 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 Per verbal order from MD  pt is allowed for toe touch weightbearing on Rt and AROM. Pt very tight in whole LE, and      Rehab Potential Excellent   Clinical Impairments Affecting Rehab Potential non weightbear on right at this time   PT Frequency 2x / week   PT Duration 4 weeks   PT Treatment/Interventions Cryotherapy;Electrical Stimulation;Moist Heat;Ultrasound;Therapeutic exercise;Therapeutic activities;Stair training;Gait training;Neuromuscular re-education;Patient/family education;Scar mobilization;Manual techniques;Passive range of motion;Dry needling;Energy conservation;Vasopneumatic Device   PT Next Visit Plan right knee and ankle ROM; non weightbear on right, right patella mobilitzation, right hip strength, soft tissue work, scar massage, isometrics; 06/30/2015 toe touch weightbear, ROM of knee and ankle   PT Home Exercise Plan progress as needed   Consulted and Agree with Plan of Care Patient      Patient will benefit from skilled therapeutic intervention in order to improve the following deficits and impairments:  Abnormal gait, Increased fascial restricitons, Pain, Decreased mobility, Decreased scar mobility, Increased muscle spasms, Decreased activity tolerance, Decreased  endurance, Decreased range of motion, Decreased strength, Impaired flexibility, Increased edema  Visit Diagnosis: Edema, unspecified  Muscle weakness (generalized)  Stiffness of right knee, not elsewhere classified  Stiffness of right ankle, not elsewhere classified  Pain in right knee  Pain in right ankle and joints of right foot  Other abnormalities of gait and mobility     Problem List Patient Active Problem List   Diagnosis Date Noted  . Closed right tibial fracture 06/07/2015    NAUMANN-HOUEGNIFIO,Marriah Sanderlin PTA 07/02/2015, 12:00 PM  [facility] [address] [place], [place], [address] Phone: [phone]   Fax:  [phone]  Name: Ryan Finley MRN: [id number] Date of Birth: 05/20/1960

## 2015-07-07 ENCOUNTER — Encounter: Payer: Self-pay | Admitting: Physical Therapy

## 2015-07-07 ENCOUNTER — Ambulatory Visit: Payer: Self-pay | Admitting: Physical Therapy

## 2015-07-07 DIAGNOSIS — M6281 Muscle weakness (generalized): Secondary | ICD-10-CM

## 2015-07-07 DIAGNOSIS — M25571 Pain in right ankle and joints of right foot: Secondary | ICD-10-CM

## 2015-07-07 DIAGNOSIS — M25561 Pain in right knee: Secondary | ICD-10-CM

## 2015-07-07 DIAGNOSIS — R609 Edema, unspecified: Secondary | ICD-10-CM

## 2015-07-07 DIAGNOSIS — R2689 Other abnormalities of gait and mobility: Secondary | ICD-10-CM

## 2015-07-07 DIAGNOSIS — M25661 Stiffness of right knee, not elsewhere classified: Secondary | ICD-10-CM

## 2015-07-07 DIAGNOSIS — M25671 Stiffness of right ankle, not elsewhere classified: Secondary | ICD-10-CM

## 2015-07-07 NOTE — Therapy (Signed)
[facility] [address], [address] [place], [place], [address] Phone: [phone]   Fax:  [phone]  Physical Therapy Treatment  Patient Details  Name: Ryan Finley Costlow MRN: [id number] Date of Birth: 1960-07-15 Referring Provider: Dr. Samson FredericBrian Swinteck  Encounter Date: 07/07/2015      PT End of Session - 07/07/15 0953    Visit Number 3   Date for PT Re-Evaluation 07/27/15   Authorization Type self pay   PT Start Time 0932   PT Stop Time 1032   PT Time Calculation (min) 60 min   Activity Tolerance Patient tolerated treatment well   Behavior [facility] for tasks assessed/performed      Past Medical History  Diagnosis Date  . Hypertension     Past Surgical History  Procedure Laterality Date  . Tibia im nail insertion Right 06/06/2015    Procedure: INTRAMEDULLARY (IM) NAIL TIBIAL;  Surgeon: Samson FredericBrian Swinteck, MD;  Location: [facility] OR;  Service: Orthopedics;  Laterality: Right;    There were no vitals filed for this visit.      Subjective Assessment - 07/07/15 0943    Subjective Pt is concerned about beeing out of work and not able to pay his bills. Pt reports pain is 3-4/10 in the morning, up to 6-7/10 with activities premedicated, muscles are very tight from knee cap down to ankle, edema present in right low leg, dominant on med malleolus   Patient Stated Goals be able to use right leg and increased mobility   Currently in Pain? Yes  with activities 6-7/10, in the am 3-4/10   Pain Score 6    Pain Location Leg   Pain Orientation Right   Pain Descriptors / Indicators Tightness;Tingling   Pain Type Acute pain   Aggravating Factors  with activities as walking with crutches   Pain Relieving Factors elevation of leg   Multiple Pain Sites No                         [facility] Adult PT Treatment/Exercise - 07/07/15 0001    Ambulation/Gait   Ambulation/Gait Yes   Assistive device Crutches   Ambulation  Surface Level   Stairs Yes   Gait Comments practiced toe touch   Exercises   Exercises Knee/Hip   Knee/Hip Exercises: Seated   Heel Slides AROM  3 x 10 foot placed on towel on board for sliding   Knee/Hip Exercises: Supine   Heel Slides AROM;3 sets  on board with towel for easier slide   Terminal Knee Extension AROM;2 sets   Knee/Hip Exercises: Sidelying   Hip ABduction AROM;Right;3 sets  improved technique today   Knee/Hip Exercises: Prone   Hamstring Curl 3 sets   Hip Extension AROM;Right;3 sets  with short arc   Prone Knee Hang 2 minutes  small towel under prox knee cap, to reduce stress on incisio   Modalities   Modalities Vasopneumatic   Vasopneumatic   Number Minutes Vasopneumatic  15 minutes   Vasopnuematic Location  Knee   Vasopneumatic Pressure Medium   Vasopneumatic Temperature  3snowflakes                  PT Short Term Goals - 07/07/15 0958    PT SHORT TERM GOAL #1   Title full mobility of right patella making it easier to flex his right knee past 100 degrees actively   Time 4   Period Weeks   Status On-going  PT SHORT TERM GOAL #2   Title increased right ankle dorsiflexion >/= 0 degrees due to imporve mobility of right achilles tendon   Time 4   Period Weeks   Status On-going   PT SHORT TERM GOAL #3   Title pain improved >/= 50% due to improve tissue mobility   Time 4   Period Weeks   Status On-going   PT SHORT TERM GOAL #4   Title right knee extension increased >/= -5 degrees due to increased right patella mobility   Time 4   Period Weeks   Status On-going           PT Long Term Goals - 06/29/15 1132    PT LONG TERM GOAL #1   Title independent with HEP    Time 12   Period Weeks   Status New   PT LONG TERM GOAL #2   Title ambulate with no assistive device with minimal gait deficits and full weight on right leg   Time 12   Period Weeks   Status New   PT LONG TERM GOAL #3   Title right knee AROM >/= 115 degrees so he is able  to go up and down stairs with a step over step pattern   Time 12   Period Weeks   Status New   PT LONG TERM GOAL #4   Title Right knee and ankle strength >/= 4+/ 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 Per verbal order form MD pt is allowed for toe touch weightbearing on Rt and AROM. Pt very tight in whole LE   Rehab Potential Excellent   Clinical Impairments Affecting Rehab Potential non weightbear on right at this time   PT Frequency 2x / week   PT Duration 4 weeks   PT Treatment/Interventions Cryotherapy;Electrical Stimulation;Moist Heat;Ultrasound;Therapeutic exercise;Therapeutic activities;Stair training;Gait training;Neuromuscular re-education;Patient/family education;Scar mobilization;Manual techniques;Passive range of motion;Dry needling;Energy conservation;Vasopneumatic Device   PT Next Visit Plan right knee and ankle ROM; non weightbear on right, right patella mobilitzation, right hip strength, soft tissue work, scar massage, isometrics; 06/30/2015 toe touch weightbear, ROM of knee and ankle   PT Home Exercise Plan progress as needed   Consulted and Agree with Plan of Care Patient      Patient will benefit from skilled therapeutic intervention in order to improve the following deficits and impairments:  Abnormal gait, Increased fascial restricitons, Pain, Decreased mobility, Decreased scar mobility, Increased muscle spasms, Decreased  activity tolerance, Decreased endurance, Decreased range of motion, Decreased strength, Impaired flexibility, Increased edema  Visit Diagnosis: Edema, unspecified  Muscle weakness (generalized)  Stiffness of right knee, not elsewhere classified  Stiffness of right ankle, not elsewhere classified  Pain in right knee  Pain in right ankle and joints of right foot  Other abnormalities of gait and mobility     Problem List Patient Active Problem List   Diagnosis Date Noted  . Closed right tibial fracture 06/07/2015    NAUMANN-HOUEGNIFIO,Florentina Marquart PTA 07/07/2015, 10:27 AM  [facility] [address] [place], [place], [address] Phone: [phone]   Fax:  [phone]  Name: BJORN HALLAS MRN: [id number] Date of Birth: 1960-01-27

## 2015-07-09 ENCOUNTER — Encounter: Payer: Self-pay | Admitting: Physical Therapy

## 2015-07-09 ENCOUNTER — Ambulatory Visit: Payer: Self-pay | Admitting: Physical Therapy

## 2015-07-09 DIAGNOSIS — M6281 Muscle weakness (generalized): Secondary | ICD-10-CM

## 2015-07-09 DIAGNOSIS — M25671 Stiffness of right ankle, not elsewhere classified: Secondary | ICD-10-CM

## 2015-07-09 DIAGNOSIS — R2689 Other abnormalities of gait and mobility: Secondary | ICD-10-CM

## 2015-07-09 DIAGNOSIS — M25571 Pain in right ankle and joints of right foot: Secondary | ICD-10-CM

## 2015-07-09 DIAGNOSIS — M25561 Pain in right knee: Secondary | ICD-10-CM

## 2015-07-09 DIAGNOSIS — R609 Edema, unspecified: Secondary | ICD-10-CM

## 2015-07-09 DIAGNOSIS — M25661 Stiffness of right knee, not elsewhere classified: Secondary | ICD-10-CM

## 2015-07-09 NOTE — Therapy (Signed)
[facility] [address], [address] [place], [place], [address] Phone: [phone]   Fax:  [phone]  Physical Therapy Treatment  Patient Details  Name: Ryan Finley MRN: [id number] Date of Birth: 24-May-1960 Referring Provider: Dr. Samson Frederic  Encounter Date: 07/09/2015      PT End of Session - 07/09/15 0903    Visit Number 4   Date for PT Re-Evaluation 07/27/15   Authorization Type self pay   PT Start Time 0838   PT Stop Time 0946   PT Time Calculation (min) 68 min   Activity Tolerance Patient tolerated treatment well   Behavior During Therapy [facility] for tasks assessed/performed      Past Medical History  Diagnosis Date  . Hypertension     Past Surgical History  Procedure Laterality Date  . Tibia im nail insertion Right 06/06/2015    Procedure: INTRAMEDULLARY (IM) NAIL TIBIAL;  Surgeon: Samson Frederic, MD;  Location: [facility] OR;  Service: Orthopedics;  Laterality: Right;    There were no vitals filed for this visit.      Subjective Assessment - 07/09/15 0849    Subjective Pt saw PA yesterday they had to slighly open the incision to remove one stitch. Pt reports his pain suprapatellar is 5/10, the calf and ankle is feeling okay. Swelling is less in the am, but increases at the afternoon.   Patient Stated Goals be able to use right leg and increased mobility   Currently in Pain? Yes   Pain Score 5    Pain Location Leg   Pain Orientation Right   Pain Descriptors / Indicators Tightness;Tingling   Pain Type Acute pain   Pain Relieving Factors elevation of leg   Multiple Pain Sites No            [facility] PT Assessment - 07/09/15 0001    ROM / Strength   AROM / PROM / Strength AROM   AROM   AROM Assessment Site Knee   Right/Left Knee Right   Right Knee Extension -8   Right Knee Flexion 100                     [facility] Adult PT Treatment/Exercise - 07/09/15 0001    Ambulation/Gait    Ambulation/Gait Yes   Assistive device Crutches   Ambulation Surface Level   Stairs --   Gait Comments practiced toe touch   Exercises   Exercises Knee/Hip   Knee/Hip Exercises: Seated   Heel Slides AROM  able to perform with out the board   Knee/Hip Exercises: Supine   Heel Slides AROM;3 sets   Terminal Knee Extension AROM;2 sets   Knee/Hip Exercises: Sidelying   Hip ABduction AROM;Right;3 sets  continues to improve less reminders for foot position   Knee/Hip Exercises: Prone   Hamstring Curl 3 sets   Hip Extension AROM;Right;3 sets  with short arc   Prone Knee Hang 2 minutes  small towel under prox knee cap to reduce stress on knee   Modalities   Modalities Vasopneumatic;Ultrasound   Ultrasound   Ultrasound Location Rt distal quadriceps   Ultrasound Parameters 100%, , 1Wcm x   Ultrasound Goals Edema  tone   Vasopneumatic   Number Minutes Vasopneumatic  15 minutes   Vasopnuematic Location  Knee   Vasopneumatic Pressure Medium   Vasopneumatic Temperature  3snowflakes   Manual Therapy   Manual Therapy Soft tissue mobilization   Soft tissue mobilization to distal quadriceps to release  tension                  PT Short Term Goals - 07/07/15 0958    PT SHORT TERM GOAL #1   Title full mobility of right patella making it easier to flex his right knee past 100 degrees actively   Time 4   Period Weeks   Status On-going   PT SHORT TERM GOAL #2   Title increased right ankle dorsiflexion >/= 0 degrees due to imporve mobility of right achilles tendon   Time 4   Period Weeks   Status On-going   PT SHORT TERM GOAL #3   Title pain improved >/= 50% due to improve tissue mobility   Time 4   Period Weeks   Status On-going   PT SHORT TERM GOAL #4   Title right knee extension increased >/= -5 degrees due to increased right patella mobility   Time 4   Period Weeks   Status On-going           PT Long Term Goals - 06/29/15 1132    PT LONG TERM GOAL #1    Title independent with HEP    Time 12   Period Weeks   Status New   PT LONG TERM GOAL #2   Title ambulate with no assistive device with minimal gait deficits and full weight on right leg   Time 12   Period Weeks   Status New   PT LONG TERM GOAL #3   Title right knee AROM >/= 115 degrees so he is able to go up and down stairs with a step over step pattern   Time 12   Period Weeks   Status New   PT LONG TERM GOAL #4   Title Right knee and ankle strength >/= 4+/ 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 Per verbal order from MD pt is allowed for toe touch weightbearing on Rt and AROM. Pt with less swelling, but  severy tenderness suprapatellar   Rehab Potential Excellent   Clinical Impairments Affecting Rehab Potential non weightbear on right at this time   PT Frequency 2x / week   PT Duration 4 weeks   PT Treatment/Interventions Cryotherapy;Electrical Stimulation;Moist Heat;Ultrasound;Therapeutic exercise;Therapeutic activities;Stair training;Gait training;Neuromuscular re-education;Patient/family education;Scar mobilization;Manual techniques;Passive range of motion;Dry needling;Energy conservation;Vasopneumatic Device   PT Next Visit Plan right knee and ankle ROM; non weightbear on right, right patella mobilitzation, right hip strength, soft tissue work, scar massage, isometrics; 06/30/2015 toe touch weightbear, ROM of knee and ankle   PT Home  Exercise Plan progress as needed   Consulted and Agree with Plan of Care Patient      Patient will benefit from skilled therapeutic intervention  in order to improve the following deficits and impairments:  Abnormal gait, Increased fascial restricitons, Pain, Decreased mobility, Decreased scar mobility, Increased muscle spasms, Decreased activity tolerance, Decreased endurance, Decreased range of motion, Decreased strength, Impaired flexibility, Increased edema  Visit Diagnosis: Edema, unspecified  Muscle weakness (generalized)  Stiffness of right knee, not elsewhere classified  Stiffness of right ankle, not elsewhere classified  Pain in right knee  Pain in right ankle and joints of right foot  Other abnormalities of gait and mobility     Problem List Patient Active Problem List   Diagnosis Date Noted  . Closed right tibial fracture 06/07/2015    NAUMANN-HOUEGNIFIO,Nashawn Hillock PTA 07/09/2015, 11:55 AM  [facility] [address], [address] [place], [place], [address] Phone: [phone]   Fax:  [phone]  Name: FENRIS CAUBLE MRN: [id number] Date of Birth: Nov 07, 1960

## 2015-07-13 ENCOUNTER — Encounter: Payer: Self-pay | Admitting: Physical Therapy

## 2015-07-13 ENCOUNTER — Ambulatory Visit: Payer: Self-pay | Admitting: Physical Therapy

## 2015-07-13 DIAGNOSIS — M6281 Muscle weakness (generalized): Secondary | ICD-10-CM

## 2015-07-13 DIAGNOSIS — R2689 Other abnormalities of gait and mobility: Secondary | ICD-10-CM

## 2015-07-13 DIAGNOSIS — M25571 Pain in right ankle and joints of right foot: Secondary | ICD-10-CM

## 2015-07-13 DIAGNOSIS — M25561 Pain in right knee: Secondary | ICD-10-CM

## 2015-07-13 DIAGNOSIS — M25671 Stiffness of right ankle, not elsewhere classified: Secondary | ICD-10-CM

## 2015-07-13 DIAGNOSIS — M25661 Stiffness of right knee, not elsewhere classified: Secondary | ICD-10-CM

## 2015-07-13 DIAGNOSIS — R609 Edema, unspecified: Secondary | ICD-10-CM

## 2015-07-13 NOTE — Therapy (Signed)
[facility] [address], [address] [place], [place], [address] Phone: [phone]   Fax:  [phone]  Physical Therapy Treatment  Patient Details  Name: Ryan Finley MRN: [id number] Date of Birth: [birth year] Referring Provider: Dr. Samson Frederic  Encounter Date: 07/13/2015      PT End of Session - 07/13/15 0904    Visit Number 5   Date for PT Re-Evaluation 07/27/15   Authorization Type self pay   PT Start Time 0842   PT Stop Time 0946   PT Time Calculation (min) 64 min   Activity Tolerance Patient tolerated treatment well   Behavior During Therapy [facility] for tasks assessed/performed      Past Medical History  Diagnosis Date  . Hypertension     Past Surgical History  Procedure Laterality Date  . Tibia im nail insertion Right 06/06/2015    Procedure: INTRAMEDULLARY (IM) NAIL TIBIAL;  Surgeon: Samson Frederic, MD;  Location: [facility] OR;  Service: Orthopedics;  Laterality: Right;    There were no vitals filed for this visit.      Subjective Assessment - 07/13/15 0851    Subjective Pt is concerned about his li mited movement (DF) in his right ankle. Pt reports popping noise in Rt knee and tightness in calf and achillestendon. Pt with sewlling worse in the late afternoon.    Patient Stated Goals be able to use right leg and increased mobility   Currently in Pain? Yes   Pain Score 4    Pain Location Leg   Pain Orientation Right   Pain Descriptors / Indicators Tightness;Tingling   Pain Type Acute pain   Multiple Pain Sites No            [facility] PT Assessment - 07/13/15 0001    Assessment   Next MD Visit 07/22/2015                     [facility] Adult PT Treatment/Exercise - 07/13/15 0001    Ambulation/Gait   Ambulation/Gait Yes   Assistive device Crutches   Ambulation Surface Level   Gait Comments practiced toe touch   Exercises   Exercises Knee/Hip   Knee/Hip Exercises: Stretches   Soleus Stretch  Right;3 reps;30 seconds  needs vc's for foot placement   Other Knee/Hip Stretches Rockerboard in sitting x 3 min   Knee/Hip Exercises: Aerobic   Stationary Bike L 1 x 10 min initially rocking   Knee/Hip Exercises: Seated   Heel Slides AROM   Knee/Hip Exercises: Supine   Heel Slides AROM;3 sets   Terminal Knee Extension AROM;2 sets   Knee/Hip Exercises: Sidelying   Hip ABduction AROM;Right;3 sets   Knee/Hip Exercises: Prone   Hamstring Curl 3 sets   Hip Extension AROM;Right;3 sets   Prone Knee Hang 2 minutes   Modalities   Modalities Vasopneumatic;Ultrasound   Ultrasound   Ultrasound Location --   Ultrasound Parameters --   Ultrasound Goals --   Vasopneumatic   Number Minutes Vasopneumatic  15 minutes   Vasopnuematic Location  Knee   Vasopneumatic Pressure Medium   Vasopneumatic Temperature  3snowflakes   Manual Therapy   Manual Therapy Soft tissue mobilization   Soft tissue mobilization to achillestendon with PROM for elongation in prone position                PT Education - 07/13/15 0947    Education provided Yes   Education Details Soleus & gastrocnemius stretch sitting and standing  PT Short Term Goals - 07/13/15 0909    PT SHORT TERM GOAL #1   Title full mobility of right patella making it easier to flex his right knee past 100 degrees actively   Time 4   Period Weeks   Status On-going   PT SHORT TERM GOAL #2   Title increased right ankle dorsiflexion >/= 0 degrees due to imporve mobility of right achilles tendon   Time 4   Period Weeks   Status On-going   PT SHORT TERM GOAL #3   Title pain improved >/= 50% due to improve tissue mobility   Time 4   Period Weeks   Status On-going   PT SHORT TERM GOAL #4   Title right knee extension increased >/= -5 degrees due to increased right patella mobility   Time 4   Period Weeks   Status On-going           PT Long Term Goals - 06/29/15 1132    PT LONG TERM GOAL #1   Title independent  with HEP    Time 12   Period Weeks   Status New   PT LONG TERM GOAL #2   Title ambulate with no assistive device with minimal gait deficits and full weight on right leg   Time 12   Period Weeks   Status New   PT LONG TERM GOAL #3   Title right knee AROM >/= 115 degrees so he is able to go up and down stairs with a step over step pattern   Time 12   Period Weeks   Status New   PT LONG TERM GOAL #4   Title Right knee and ankle strength >/= 4+/ 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 Per verbal order form MD - pt is allowed for toe touch weightbearing on Rt and AROM. Pt was able to ride the stationary bike with full revolution   Rehab Potential Excellent   Clinical Impairments Affecting Rehab Potential non weightbear on right at this time   PT Frequency 2x / week   PT Duration 4 weeks   PT Treatment/Interventions Cryotherapy;Electrical Stimulation;Moist Heat;Ultrasound;Therapeutic exercise;Therapeutic activities;Stair training;Gait training;Neuromuscular re-education;Patient/family education;Scar mobilization;Manual techniques;Passive range of motion;Dry needling;Energy conservation;Vasopneumatic Device   PT Next Visit Plan Continue stationary bike, Rockerboard in sitting to imporve ROM Rt ankle, right patella and ankle mobilitzation, right hip strength, soft tissue work, scar massage, game ready   PT Home Exercise Plan progress as needed    Consulted and Agree with Plan of Care Patient      Patient will benefit from skilled therapeutic intervention in order to improve the following deficits and impairments:  Abnormal gait, Increased fascial restricitons, Pain, Decreased mobility, Decreased  scar mobility, Increased muscle spasms, Decreased activity tolerance, Decreased endurance, Decreased range of motion, Decreased strength, Impaired flexibility, Increased edema  Visit Diagnosis: Edema, unspecified  Muscle weakness (generalized)  Stiffness of right knee, not elsewhere classified  Stiffness of right ankle, not elsewhere classified  Pain in right knee  Pain in right ankle and joints of right foot  Other abnormalities of gait and mobility     Problem List Patient Active Problem List   Diagnosis Date Noted  . Closed right tibial fracture 06/07/2015    NAUMANN-HOUEGNIFIO,Rechelle Niebla PTA 07/13/2015, 9:56 AM  [facility] [address] [place], [place], [address] Phone: [phone]   Fax:  [phone]  Name: Clide CliffJohn M Micalizzi MRN: [id number] Date of Birth: [birth year]

## 2015-07-13 NOTE — Patient Instructions (Signed)
SEATED Gastroc / Heel Cord Stretch - Seated With Towel   Sit on floor, towel around ball of foot. Gently pull foot in toward body, stretching heel cord and calf. Hold for _30__ seconds. Repeat on involved leg. Repeat __3_ times. Do _3__ times per day.  SEATED Soleus Stretch: ANKLE: Dorsiflexion - Sitting   Sitting, place strap around foot. Pull foot toward body, keeping heel on floor. Keep foot straight. Hold _30__ seconds. _3__ reps per set, __3_ sets per day, _7__ days per week  Achilles / Gastroc, Standing  Resprect your weightbearing restrictions Stand, right foot behind, heel on floor and turned slightly out, leg straight, forward leg bent. Move hips forward. Hold _30__ seconds. Repeat _3__ times per session. Do _3__ sessions per day.   Stretching: Soleus  Respect your weightbearing restrictions  Stand with right foot back, both knees bent. Keeping heel on floor, turned slightly out, lean into wall until stretch is felt in lower calf. Hold _30___ seconds. Repeat __3__ times per set. Do __3__ sessions per day.  [email or web address].us/665   Dorsiflexion: Self-Mobilization (Sitting)   Feet flat, other foot forward, slide left foot back until gentle stretch is felt. Keep entire foot on floor. Hold _30___ seconds. Relax. Repeat __3__ times per set. Do __3__ sessions per day.  [email or web address]

## 2015-07-15 ENCOUNTER — Ambulatory Visit: Payer: Self-pay | Admitting: Physical Therapy

## 2015-07-15 ENCOUNTER — Encounter: Payer: Self-pay | Admitting: Physical Therapy

## 2015-07-15 DIAGNOSIS — M25661 Stiffness of right knee, not elsewhere classified: Secondary | ICD-10-CM

## 2015-07-15 DIAGNOSIS — M25671 Stiffness of right ankle, not elsewhere classified: Secondary | ICD-10-CM

## 2015-07-15 DIAGNOSIS — R2689 Other abnormalities of gait and mobility: Secondary | ICD-10-CM

## 2015-07-15 DIAGNOSIS — M6281 Muscle weakness (generalized): Secondary | ICD-10-CM

## 2015-07-15 DIAGNOSIS — M25571 Pain in right ankle and joints of right foot: Secondary | ICD-10-CM

## 2015-07-15 DIAGNOSIS — M25561 Pain in right knee: Secondary | ICD-10-CM

## 2015-07-15 DIAGNOSIS — R609 Edema, unspecified: Secondary | ICD-10-CM

## 2015-07-15 NOTE — Therapy (Signed)
[facility] [address], [address] [place], [place], [address] Phone: [phone]   Fax:  [phone]  Physical Therapy Treatment  Patient Details  Name: Ryan Finley MRN: [id number] Date of Birth: November 17, 1960 Referring Provider: Dr. Samson FredericBrian Swinteck  Encounter Date: 07/15/2015      PT End of Session - 07/15/15 0909    Visit Number 6   Date for PT Re-Evaluation 07/27/15   Authorization Type self pay   PT Start Time 0840   PT Stop Time 0947   PT Time Calculation (min) 67 min   Activity Tolerance Patient tolerated treatment well   Behavior During Therapy [facility] for tasks assessed/performed      Past Medical History  Diagnosis Date  . Hypertension     Past Surgical History  Procedure Laterality Date  . Tibia im nail insertion Right 06/06/2015    Procedure: INTRAMEDULLARY (IM) NAIL TIBIAL;  Surgeon: Samson FredericBrian Swinteck, MD;  Location: [facility] OR;  Service: Orthopedics;  Laterality: Right;    There were no vitals filed for this visit.      Subjective Assessment - 07/15/15 0859    Subjective Pt continues to be concerned about the limited movement (DF) in his right ankle. The knee is loosening up, but still popping noise. ankle is still swelling up in the afternoon.    Patient Stated Goals be able to use right leg and increased mobility   Currently in Pain? Yes   Pain Score 3    Pain Location Leg   Pain Orientation Right   Pain Descriptors / Indicators Tightness;Tingling   Pain Type Acute pain   Aggravating Factors  with activities as walking with crutches   Pain Relieving Factors elevation of legs, game ready   Multiple Pain Sites No                         [facility] Adult PT Treatment/Exercise - 07/15/15 0001    Ambulation/Gait   Ambulation/Gait Yes   Assistive device Crutches   Ambulation Surface Level   Gait Comments practiced toe touch   Exercises   Exercises Knee/Hip   Knee/Hip Exercises: Stretches   Gastroc Stretch Right;3 reps;30 seconds   Soleus Stretch Right;3 reps;30 seconds  needs vc's for technique   Other Knee/Hip Stretches Rockerboard in sitting x 3 min   Knee/Hip Exercises: Aerobic   Stationary Bike L 1 x 12 min    Knee/Hip Exercises: Standing   Knee Flexion AROM;20 reps  Hamstrings curls 2 x10   Hip Abduction Stengthening;2 sets;10 reps   Knee/Hip Exercises: Seated   Heel Slides AROM   Modalities   Modalities Vasopneumatic;Ultrasound   Vasopneumatic   Number Minutes Vasopneumatic  15 minutes   Vasopnuematic Location  Knee   Vasopneumatic Pressure Medium   Vasopneumatic Temperature  3snowflakes   Manual Therapy   Manual Therapy Soft tissue mobilization   Soft tissue mobilization to achillestendon 7 gastrocnemius with PROM for elongation in prone position                  PT Short Term Goals - 07/13/15 0909    PT SHORT TERM GOAL #1   Title full mobility of right patella making it easier to flex his right knee past 100 degrees actively   Time 4   Period Weeks   Status On-going   PT SHORT TERM GOAL #2   Title increased right ankle dorsiflexion >/= 0 degrees due to imporve mobility of  right achilles tendon   Time 4   Period Weeks   Status On-going   PT SHORT TERM GOAL #3   Title pain improved >/= 50% due to improve tissue mobility   Time 4   Period Weeks   Status On-going   PT SHORT TERM GOAL #4   Title right knee extension increased >/= -5 degrees due to increased right patella mobility   Time 4   Period Weeks   Status On-going           PT Long Term Goals - 06/29/15 1132    PT LONG TERM GOAL #1   Title independent with HEP    Time 12   Period Weeks   Status New   PT LONG TERM GOAL #2   Title ambulate with no assistive device with minimal gait deficits and full weight on right leg   Time 12   Period Weeks   Status New   PT LONG TERM GOAL #3   Title right knee AROM >/= 115 degrees so he is able to go up and down stairs with a step  over step pattern   Time 12   Period Weeks   Status New   PT LONG TERM GOAL #4   Title Right knee and ankle strength >/= 4+/ 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 Per verbal order from MD pt is allowed for toe touch weightbearing on Rt and AROM. Pt able to perform Rt hip ABD in standing. Pt will continue to benefit from skillet PT to advance ROM, strength and endurance   Rehab Potential Excellent   Clinical Impairments Affecting Rehab Potential non weightbear on right at this time   PT Frequency 2x / week   PT Duration 4 weeks   PT Treatment/Interventions Cryotherapy;Electrical Stimulation;Moist Heat;Ultrasound;Therapeutic exercise;Therapeutic activities;Stair training;Gait training;Neuromuscular re-education;Patient/family education;Scar mobilization;Manual techniques;Passive range of motion;Dry needling;Energy conservation;Vasopneumatic Device   PT Next Visit Plan Continue stationary bike, Rockerboard in sitting to improve ROM Rt ankle, right patella and ankle mobilitzation, right hip strength, soft tissue work, scar massage, game ready   PT Home Exercise Plan progress as needed   Consulted and Agree with Plan of Care Patient      Patient will benefit from skilled therapeutic intervention in order to improve the following deficits and impairments:  Abnormal gait, Increased fascial restricitons, Pain, Decreased mobility, Decreased scar  mobility, Increased muscle spasms, Decreased activity tolerance, Decreased endurance, Decreased range of motion, Decreased strength, Impaired flexibility, Increased edema  Visit Diagnosis: Edema, unspecified  Muscle weakness (generalized)  Stiffness of right knee, not elsewhere classified  Stiffness of right ankle, not elsewhere classified  Pain in right knee  Pain in right ankle and joints  of right foot  Other abnormalities of gait and mobility     Problem List Patient Active Problem List   Diagnosis Date Noted  . Closed right tibial fracture 06/07/2015    NAUMANN-HOUEGNIFIO,Mette [place] PTA 07/15/2015, 9:37 AM  [facility] [address] [place], [place], [address] Phone: [phone]   Fax:  [phone]  Name: JAVIONE GUNAWAN MRN: [id number] Date of Birth: Jun 05, 1960

## 2015-07-20 ENCOUNTER — Ambulatory Visit: Payer: Self-pay | Admitting: Physical Therapy

## 2015-07-20 ENCOUNTER — Encounter: Payer: Self-pay | Admitting: Physical Therapy

## 2015-07-20 DIAGNOSIS — M25661 Stiffness of right knee, not elsewhere classified: Secondary | ICD-10-CM

## 2015-07-20 DIAGNOSIS — R609 Edema, unspecified: Secondary | ICD-10-CM

## 2015-07-20 DIAGNOSIS — M25571 Pain in right ankle and joints of right foot: Secondary | ICD-10-CM

## 2015-07-20 DIAGNOSIS — M6281 Muscle weakness (generalized): Secondary | ICD-10-CM

## 2015-07-20 DIAGNOSIS — M25671 Stiffness of right ankle, not elsewhere classified: Secondary | ICD-10-CM

## 2015-07-20 DIAGNOSIS — R2689 Other abnormalities of gait and mobility: Secondary | ICD-10-CM

## 2015-07-20 DIAGNOSIS — M25561 Pain in right knee: Secondary | ICD-10-CM

## 2015-07-20 NOTE — Therapy (Signed)
[facility] [address], [address] [place], [place], [address] Phone: [phone]   Fax:  [phone]  Physical Therapy Treatment  Patient Details  Name: Ryan CliffJohn Finley Stille MRN: [id number] Date of Birth: July 05, 1960 Referring Provider: Dr. Samson FredericBrian Swinteck  Encounter Date: 07/20/2015      PT End of Session - 07/20/15 0850    Visit Number 7   Date for PT Re-Evaluation 07/27/15   Authorization Type self pay   PT Start Time 0845   PT Stop Time 0950   PT Time Calculation (min) 65 min   Activity Tolerance Patient tolerated treatment well   Behavior During Therapy [facility] for tasks assessed/performed      Past Medical History  Diagnosis Date  . Hypertension     Past Surgical History  Procedure Laterality Date  . Tibia im nail insertion Right 06/06/2015    Procedure: INTRAMEDULLARY (IM) NAIL TIBIAL;  Surgeon: Samson FredericBrian Swinteck, MD;  [facility]: [facility] OR;  Service: Orthopedics;  Laterality: Right;    There were no vitals filed for this visit.      Subjective Assessment - 07/20/15 0851    Subjective KNee continues to hurt and pops, feels like my kneecap catches. Goes to MD this Wednesday.    Currently in Pain? No/denies  Ankle feels stiff currently, knee was 4/10 upon waking this AM.             [facility] PT Assessment - 07/20/15 0001    AROM   Right Knee Extension 8   Right Knee Flexion 116   Right Ankle Dorsiflexion -6   PROM   Right Knee Extension 5                     [facility] Adult PT Treatment/Exercise - 07/20/15 0001    Knee/Hip Exercises: Stretches   Gastroc Stretch --  Towel stretch for gastroc and knee extension 20 sec x 4 sets   Other Knee/Hip Stretches Rockerboard in sitting x 3 min   Knee/Hip Exercises: Aerobic   Stationary Bike L 1 x 12 min    Knee/Hip Exercises: Seated   Sit to [employer] --  BAPS L 1 & 2 20x with TC for LE alignment   Vasopneumatic   Number Minutes Vasopneumatic  15 minutes   Vasopnuematic  Location  Knee   Vasopneumatic Pressure Medium   Vasopneumatic Temperature  3snowflakes   Manual Therapy   Manual Therapy Soft tissue mobilization   Soft tissue mobilization RT ankle and lower leg                  PT Short Term Goals - 07/20/15 [id number]    PT SHORT TERM GOAL #3   Title pain improved >/= 50% due to improve tissue mobility   Time 4   Period Weeks   Status --  Unsure bc pt has been taking the medication.            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 He currently is toe touch weight bearing with axillary crutches. He has greatly improved his knee ROM since eval. He will see MD on Wednesday. Despite ankle dorsiflexion being limited it has increased significanlty since evaluation.    Rehab Potential Excellent   Clinical Impairments Affecting Rehab Potential non weightbear on right at this time   PT Frequency 2x / week   PT Duration 4 weeks    PT Treatment/Interventions Cryotherapy;Electrical Stimulation;Moist Heat;Ultrasound;Therapeutic exercise;Therapeutic activities;Stair training;Gait training;Neuromuscular re-education;Patient/family education;Scar mobilization;Manual techniques;Passive range of motion;Dry needling;Energy conservation;Vasopneumatic Device   PT Next Visit Plan See what MD says.   PT Home Exercise Plan progress as needed   Consulted and Agree with Plan of Care Patient      Patient will benefit from skilled therapeutic intervention in order to improve the following deficits and impairments:  Abnormal gait, Increased fascial restricitons, Pain, Decreased mobility, Decreased scar mobility, Increased muscle spasms, Decreased activity tolerance, Decreased endurance, Decreased range of motion, Decreased strength, Impaired flexibility, Increased edema  Visit Diagnosis: Edema, unspecified  Muscle weakness (generalized)  Stiffness of right knee, not elsewhere classified  Stiffness of right ankle, not elsewhere classified  Pain in right knee  Pain in right ankle and joints of right foot  Other abnormalities of gait and mobility     Problem List Patient Active Problem List   Diagnosis Date Noted  . Closed right tibial fracture 06/07/2015    Ellieanna Funderburg, PTA 07/20/2015, 9:39 AM  [facility] [address], [address] [place], [place], [address] Phone: [phone]   Fax:  [phone]  Name: Ryan CliffJohn Finley Crear MRN: [id number] Date of Birth: 1960/03/17

## 2015-07-22 ENCOUNTER — Encounter: Payer: Self-pay | Admitting: Physical Therapy

## 2015-07-22 ENCOUNTER — Ambulatory Visit: Payer: Self-pay | Admitting: Physical Therapy

## 2015-07-22 DIAGNOSIS — M25671 Stiffness of right ankle, not elsewhere classified: Secondary | ICD-10-CM

## 2015-07-22 DIAGNOSIS — M6281 Muscle weakness (generalized): Secondary | ICD-10-CM

## 2015-07-22 DIAGNOSIS — R2689 Other abnormalities of gait and mobility: Secondary | ICD-10-CM

## 2015-07-22 DIAGNOSIS — M25571 Pain in right ankle and joints of right foot: Secondary | ICD-10-CM

## 2015-07-22 DIAGNOSIS — R609 Edema, unspecified: Secondary | ICD-10-CM

## 2015-07-22 DIAGNOSIS — M25561 Pain in right knee: Secondary | ICD-10-CM

## 2015-07-22 DIAGNOSIS — M25661 Stiffness of right knee, not elsewhere classified: Secondary | ICD-10-CM

## 2015-07-22 NOTE — Therapy (Signed)
[facility] [address], [address] [place], [place], [address] Phone: [phone]   Fax:  [phone]  Physical Therapy Treatment  Patient Details  Name: Ryan Finley Houchin MRN: [id number] Date of Birth: February 19, 1960 Referring Provider: Dr. Samson FredericBrian Swinteck  Encounter Date: 07/22/2015      PT End of Session - 07/22/15 1011    Visit Number 8   Date for PT Re-Evaluation 07/27/15   Authorization Type self pay   PT Start Time 1010   PT Stop Time 1115   PT Time Calculation (min) 65 min   Activity Tolerance Patient tolerated treatment well   Behavior During Therapy [facility] for tasks assessed/performed      Past Medical History  Diagnosis Date  . Hypertension     Past Surgical History  Procedure Laterality Date  . Tibia im nail insertion Right 06/06/2015    Procedure: INTRAMEDULLARY (IM) NAIL TIBIAL;  Surgeon: Samson FredericBrian Swinteck, MD;  Location: [facility] OR;  Service: Orthopedics;  Laterality: Right;    There were no vitals filed for this visit.      Subjective Assessment - 07/22/15 1012    Subjective Saw MD this AM: fractures are healing but not yet closed via xray. Pt can be WBAT with bil axillary crutches and MD does not want pt to put any lateral stress on his ankle. returns to MD in 6 weeks. MD ordered 2x week for 4 more weeks.    Pain Score 3    Pain Location Knee   Pain Orientation Right   Pain Descriptors / Indicators Tightness   Aggravating Factors  Putting lateral pressure on ankle/lower leg   Pain Relieving Factors rest, game ready   Multiple Pain Sites No                         [facility] Adult PT Treatment/Exercise - 07/22/15 0001    Knee/Hip Exercises: Stretches   Knee: Self-Stretch to increase Flexion Right;10 seconds  2 x 10   Knee/Hip Exercises: Seated   Long Arc Quad Strengthening;Right;2 sets;10 reps   Moist Heat Therapy   Number Minutes Moist Heat 10 Minutes   Moist Heat Location Ankle   Vasopneumatic    Number Minutes Vasopneumatic  15 minutes   Vasopnuematic Location  Knee   Vasopneumatic Pressure Medium   Vasopneumatic Temperature  3snowflakes   Manual Therapy   Manual Therapy Soft tissue mobilization  including MFR   Soft tissue mobilization Rt  ankle, lower leg and peripatella   Ankle Exercises: Seated   BAPS Level 2  DF/PF 20x2, seated                PT Education - 07/22/15 1038    Education provided Yes   Education Details LAQ   Person(s) Educated Patient   Methods Explanation;Demonstration;Verbal cues;Tactile cues   Comprehension Verbalized understanding;Returned demonstration          PT Short Term Goals - 07/20/15 0853    PT SHORT TERM GOAL #3   Title pain improved >/= 50% due to improve tissue mobility   Time 4   Period Weeks   Status --  Unsure bc pt has been taking the medication.            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 He is now cleared for WBAT with bil axillary crutches. Added LAQ to HEP today. He was able to perform L2 on BAPS board with much less difficulty.    Rehab Potential Excellent   Clinical Impairments Affecting Rehab Potential non weightbear on right at this time   PT Frequency 2x / week   PT Duration 4 weeks   PT Treatment/Interventions Cryotherapy;Electrical Stimulation;Moist Heat;Ultrasound;Therapeutic exercise;Therapeutic activities;Stair training;Gait training;Neuromuscular re-education;Patient/family education;Scar mobilization;Manual techniques;Passive range of motion;Dry needling;Energy conservation;Vasopneumatic Device   PT Next Visit Plan ERO next visit, continue with PROM/AAROM/AROM of RT ankle. Strengthen the Rt knee.   Consulted and Agree with Plan of Care Patient      Patient will benefit from skilled therapeutic intervention in order to improve the following deficits and impairments:  Abnormal gait, Increased fascial restricitons, Pain, Decreased mobility, Decreased scar mobility, Increased muscle spasms, Decreased activity tolerance, Decreased endurance, Decreased range of motion, Decreased strength, Impaired flexibility, Increased edema  Visit Diagnosis: Edema, unspecified  Muscle weakness (generalized)  Stiffness of right knee, not elsewhere classified  Stiffness of right ankle, not elsewhere classified  Pain in right knee  Pain in right ankle and joints of right foot  Other abnormalities of gait and mobility     Problem List Patient Active Problem List   Diagnosis Date Noted  . Closed right tibial fracture 06/07/2015    Aydin Hink, PTA 07/22/2015, 11:04 AM  [facility] [address], [address] [place], [place], [address] Phone: [phone]   Fax:  [phone]  Name: Ryan Finley Chasen MRN: [id number] Date of Birth: Dec 19, 1960

## 2015-07-27 ENCOUNTER — Ambulatory Visit: Payer: Self-pay | Attending: Orthopedic Surgery

## 2015-07-27 DIAGNOSIS — M25571 Pain in right ankle and joints of right foot: Secondary | ICD-10-CM | POA: Insufficient documentation

## 2015-07-27 DIAGNOSIS — M25671 Stiffness of right ankle, not elsewhere classified: Secondary | ICD-10-CM | POA: Insufficient documentation

## 2015-07-27 DIAGNOSIS — R2689 Other abnormalities of gait and mobility: Secondary | ICD-10-CM | POA: Insufficient documentation

## 2015-07-27 DIAGNOSIS — R609 Edema, unspecified: Secondary | ICD-10-CM | POA: Insufficient documentation

## 2015-07-27 DIAGNOSIS — M6281 Muscle weakness (generalized): Secondary | ICD-10-CM | POA: Insufficient documentation

## 2015-07-27 DIAGNOSIS — M25661 Stiffness of right knee, not elsewhere classified: Secondary | ICD-10-CM | POA: Insufficient documentation

## 2015-07-27 DIAGNOSIS — M25561 Pain in right knee: Secondary | ICD-10-CM | POA: Insufficient documentation

## 2015-07-27 NOTE — Therapy (Addendum)
[facility] [address], [address] [place], [place], [address] Phone: [phone]   Fax:  [phone]  Physical Therapy Treatment  Patient Details  Name: Ryan Finley MRN: [id number] Date of Birth: 09-14-1960 Referring Provider: Dr. Samson FredericBrian Swinteck  Encounter Date: 07/27/2015      PT End of Session - 07/27/15 0928    Visit Number 9   Date for PT Re-Evaluation 09/25/15   PT Start Time 0839   PT Stop Time 0939   PT Time Calculation (min) 60 min   Activity Tolerance Patient tolerated treatment well   Behavior During Therapy [facility] for tasks assessed/performed      Past Medical History  Diagnosis Date  . Hypertension     Past Surgical History  Procedure Laterality Date  . Tibia im nail insertion Right 06/06/2015    Procedure: INTRAMEDULLARY (IM) NAIL TIBIAL;  Surgeon: Samson FredericBrian Swinteck, MD;  Location: [facility] OR;  Service: Orthopedics;  Laterality: Right;    There were no vitals filed for this visit.      Subjective Assessment - 07/27/15 0857    Subjective Pt reports that knee is hurting today.     Currently in Pain? No/denies   Pain Score 4    Pain Location Knee   Pain Orientation Right   Pain Descriptors / Indicators Tightness;Sore   Pain Type Acute pain   Aggravating Factors  staying still for too long, early morning, late in the day   Pain Relieving Factors rest, ice, middle of the day            [facility] PT Assessment - 07/27/15 0001    Assessment   Medical Diagnosis closed displaced spiral fracture of right tibia with routine healing S82.241D   Precautions   Precautions Other (comment)   Precaution Comments WBAT with crutches now   Balance Screen   Has the patient fallen in the past 6 months No   Has the patient had a decrease in activity level because of a fear of falling?  No   Is the patient reluctant to leave their home because of a fear of falling?  No   Prior Function   Level of Independence Independent    Vocation Full time employment   Vocation Requirements climb ladders, standing, lift 50#   Observation/Other Assessments   Focus on Therapeutic Outcomes (FOTO)  64% limitation   Observation/Other Assessments-Edema    Edema Circumferential   ROM / Strength   AROM / PROM / Strength AROM;PROM;Strength   AROM   AROM Assessment Site Knee;Ankle   Right Knee Extension 5   Right Knee Flexion 121   Right Ankle Dorsiflexion -3   Strength   Overall Strength Deficits   Overall Strength Comments Rt knee 4+/5, DF 4/5 within available AROM   Ambulation/Gait   Ambulation/Gait Yes   Ambulation/Gait Assistance 6: Modified independent (Device/Increase time)   Assistive device Crutches   Gait Pattern Step-through pattern;Decreased step length - right;Decreased stride length;Decreased dorsiflexion - right;Decreased stance time - right   Ambulation Surface Level                     [facility] Adult PT Treatment/Exercise - 07/27/15 0001    Ambulation/Gait   Ambulation Distance (Feet) 400 Feet   Gait Comments heel strike and stance time   Knee/Hip Exercises: Aerobic   Stationary Bike L 1 x 15 min    Knee/Hip Exercises: Seated   Long Arc Quad Strengthening;Right;2 sets;10 reps;Weights  Long Arc Quad Weight 2 lbs.   Knee/Hip Exercises: Supine   Heel Slides AROM;3 sets   Moist Heat Therapy   Number Minutes Moist Heat 10 Minutes   Moist Heat Location Ankle   Vasopneumatic   Number Minutes Vasopneumatic  15 minutes   Vasopnuematic Location  Knee   Vasopneumatic Pressure Medium   Vasopneumatic Temperature  3snowflakes   Ankle Exercises: Seated   Heel Raises 20 reps                  PT Short Term Goals - 07/27/15 0854    PT SHORT TERM GOAL #3   Title pain improved >/= 50% due to improve tissue mobility   Status Achieved           PT Long Term Goals - 07/27/15 0854    PT LONG TERM GOAL #1   Title independent with HEP    Time 12   Period Weeks   Status On-going    PT LONG TERM GOAL #2   Title ambulate with no assistive device with minimal gait deficits and full weight on right leg   Time 12   Period Weeks   Status On-going   PT LONG TERM GOAL #3   Title right knee AROM >/= 115 degrees so he is able to go up and down stairs with a step over step pattern   Time --   Period --   Status Achieved   PT LONG TERM GOAL #4   Title Right knee and ankle strength >/= 4+/ 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, hospital   Time 8   Period Weeks   Status On-going   PT LONG TERM GOAL #5   Title drive with right foot due to right ankle Dorsiflexion >/= 15 degrees AROM    Time 8   Period Weeks   Status On-going   Additional Long Term Goals   Additional Long Term Goals Yes   PT LONG TERM GOAL #6   Title FOTO score is </= 47% limitation   Time 8   Period Weeks   Status On-going  64% limitation   PT LONG TERM GOAL #7   Title improve Rt LE strength and ROM to asecend steps with step-over-step gait with use of 1 rail   Time 8   Period Weeks   Status On-going               Plan - 07/27/15 0911    Clinical Impression Statement Pt continues to demonstrate angalgic gait with use of 2 crutches.  Improved pattern after gait training with emphasis on heel strike and stance time to improve symmetry.  Pt is WBAT with crutches at this time.  AROM of Rt knee and ankle is improving.  Pt will benefit from skilled PT for continued advancement of strength, AROM and gait training to improve strength and mobility and safety with return to work.     Rehab Potential Excellent   PT Frequency 2x / week   PT Duration 8 weeks   PT Treatment/Interventions Cryotherapy;Electrical Stimulation;Moist Heat;Ultrasound;Therapeutic exercise;Therapeutic activities;Stair training;Gait training;Neuromuscular re-education;Patient/family education;Scar mobilization;Manual techniques;Passive range of motion;Dry needling;Energy conservation;Vasopneumatic Device   PT Next Visit  Plan Continue to imrpove AROM, strength, gait   Consulted and Agree with Plan of Care Patient      Patient will benefit from skilled therapeutic intervention in order to improve the following deficits and impairments:  Abnormal gait, Increased fascial restricitons, Pain, Decreased  mobility, Decreased scar mobility, Increased muscle spasms, Decreased activity tolerance, Decreased endurance, Decreased range of motion, Decreased strength, Impaired flexibility, Increased edema  Visit Diagnosis: Edema, unspecified - Plan: PT plan of care cert/re-cert  Muscle weakness (generalized) - Plan: PT plan of care cert/re-cert  Stiffness of right knee, not elsewhere classified - Plan: PT plan of care cert/re-cert  Stiffness of right ankle, not elsewhere classified - Plan: PT plan of care cert/re-cert  Pain in right knee - Plan: PT plan of care cert/re-cert  Pain in right ankle and joints of right foot - Plan: PT plan of care cert/re-cert  Other abnormalities of gait and mobility - Plan: PT plan of care cert/re-cert     Problem List Patient Active Problem List   Diagnosis Date Noted  . Closed right tibial fracture 06/07/2015     Lorrene ReidKelly Takacs, PT 07/27/2015 9:33 AM  [facility] [address], [address] [place], [place], [address] Phone: [phone]   Fax:  [phone]  Name: Ryan Finley MRN: [id number] Date of Birth: 1960/07/18

## 2015-07-29 ENCOUNTER — Ambulatory Visit: Payer: Self-pay

## 2015-07-29 DIAGNOSIS — M25671 Stiffness of right ankle, not elsewhere classified: Secondary | ICD-10-CM

## 2015-07-29 DIAGNOSIS — M25661 Stiffness of right knee, not elsewhere classified: Secondary | ICD-10-CM

## 2015-07-29 DIAGNOSIS — M6281 Muscle weakness (generalized): Secondary | ICD-10-CM

## 2015-07-29 DIAGNOSIS — M25561 Pain in right knee: Secondary | ICD-10-CM

## 2015-07-29 DIAGNOSIS — R2689 Other abnormalities of gait and mobility: Secondary | ICD-10-CM

## 2015-07-29 DIAGNOSIS — R609 Edema, unspecified: Secondary | ICD-10-CM

## 2015-07-29 DIAGNOSIS — M25571 Pain in right ankle and joints of right foot: Secondary | ICD-10-CM

## 2015-07-29 NOTE — Therapy (Signed)
[facility] [address], [address] [place], [place], [address] Phone: [phone]   Fax:  [phone]  Physical Therapy Treatment  Patient Details  Name: Ryan Finley MRN: [id number] Date of Birth: [birth year] Referring Provider: Dr. Samson FredericBrian Swinteck  Encounter Date: 07/29/2015      PT End of Session - 07/29/15 0913    Visit Number 10   Date for PT Re-Evaluation 09/25/15   Authorization Type self pay   PT Start Time 0836   PT Stop Time 0933   PT Time Calculation (min) 57 min   Activity Tolerance Patient tolerated treatment well   Behavior During Therapy [facility] for tasks assessed/performed      Past Medical History  Diagnosis Date  . Hypertension     Past Surgical History  Procedure Laterality Date  . Tibia im nail insertion Right 06/06/2015    Procedure: INTRAMEDULLARY (IM) NAIL TIBIAL;  Surgeon: Samson FredericBrian Swinteck, MD;  [facility];  Service: Orthopedics;  Laterality: Right;    There were no vitals filed for this visit.      Subjective Assessment - 07/29/15 0839    Subjective Pt is wearing s shoe today.     Currently in Pain? Yes   Pain Score 4    Pain Location Knee   Pain Orientation Right   Pain Descriptors / Indicators Tightness;Sore   Pain Type Acute pain   Pain Onset More than a month ago   Pain Frequency Intermittent   Aggravating Factors  early morning hours, late in the day   Pain Relieving Factors rest, ice, middle of the day                         [facility] Treatment/Exercise - 07/29/15 0001    Ambulation/Gait   Ambulation/Gait Yes   Ambulation/Gait Assistance 6: Modified independent (Device/Increase time)   Ambulation Distance (Feet) --  6 minutes   Gait Pattern Step-through pattern   Ambulation Surface Level   Gait velocity 1.0 mph  using treadmill   Pre-Gait Activities use of mirror for visual feedback   Gait Comments neutral Rt hip and heel/toe progression with gait    Knee/Hip Exercises: Aerobic   Nustep Level 5 x 10 minutes  seat 8, arms 10    Knee/Hip Exercises: Standing   Rebounder weighshifting with neutral hip posture 3 ways x 1 minute each   Knee/Hip Exercises: Seated   Long Arc Quad Strengthening;Right;2 sets;10 reps;Weights   Long Arc Quad Weight 3 lbs.   Moist Heat Therapy   Number Minutes Moist Heat 10 Minutes   Moist Heat Location Ankle   Vasopneumatic   Number Minutes Vasopneumatic  15 minutes   Vasopnuematic Location  Knee   Vasopneumatic Pressure Medium   Vasopneumatic Temperature  3snowflakes   Ankle Exercises: Seated   BAPS Level 2;Sitting  DF/PF, inversion/eversion and circles bil. x 1 min each                  PT Short Term Goals - 07/27/15 0854    PT SHORT TERM GOAL #3   Title pain improved >/= 50% due to improve tissue mobility   Status Achieved           PT Long Term Goals - 07/27/15 0854    PT LONG TERM GOAL #1   Title independent with HEP    Time 12   Period Weeks   Status On-going   PT LONG  TERM GOAL #2   Title ambulate with no assistive device with minimal gait deficits and full weight on right leg   Time 12   Period Weeks   Status On-going   PT LONG TERM GOAL #3   Title right knee AROM >/= 115 degrees so he is able to go up and down stairs with a step over step pattern   Time --   Period --   Status Achieved   PT LONG TERM GOAL #4   Title Right knee and ankle strength >/= 4+/ 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, hospitalcommercial painter   Time 8   Period Weeks   Status On-going   PT LONG TERM GOAL #5   Title drive with right foot due to right ankle Dorsiflexion >/= 15 degrees AROM    Time 8   Period Weeks   Status On-going   Additional Long Term Goals   Additional Long Term Goals Yes   PT LONG TERM GOAL #6   Title FOTO score is </= 47% limitation   Time 8   Period Weeks   Status On-going  64% limitation   PT LONG TERM GOAL #7   Title improve Rt LE strength and ROM to asecend  steps with step-over-step gait with use of 1 rail   Time 8   Period Weeks   Status On-going               Plan - 07/29/15 0840    Clinical Impression Statement Pt is wearing a shoe today and working on WBAT with use of 1 crutch.  Pt with ER at Rt foot and he is working to correct with gait.  Improved gait pattern today with emphasis on heel strike and stance time to improve symmetry.  Pt with continued Rt LE weakness and limited AROM and will benefit from skilled PT for continued advancement of strength, AROM and gait training to improve strength and mobility and safety with return to work.    Rehab Potential Excellent   Clinical Impairments Affecting Rehab Potential WBAT on Rt LE with 1 crutch   PT Frequency 2x / week   PT Duration 8 weeks   PT Treatment/Interventions Cryotherapy;Electrical Stimulation;Moist Heat;Ultrasound;Therapeutic exercise;Therapeutic activities;Stair training;Gait training;Neuromuscular re-education;Patient/family education;Scar mobilization;Manual techniques;Passive range of motion;Dry needling;Energy conservation;Vasopneumatic Device   PT Next Visit Plan Continue to imrpove AROM, strength, gait   Consulted and Agree with Plan of Care Patient      Patient will benefit from skilled therapeutic intervention in order to improve the following deficits and impairments:  Abnormal gait, Increased fascial restricitons, Pain, Decreased mobility, Decreased scar mobility, Increased muscle spasms, Decreased activity tolerance, Decreased endurance, Decreased range of motion, Decreased strength, Impaired flexibility, Increased edema  Visit Diagnosis: Edema, unspecified  Muscle weakness (generalized)  Stiffness of right knee, not elsewhere classified  Stiffness of right ankle, not elsewhere classified  Pain in right knee  Pain in right ankle and joints of right foot  Other abnormalities of gait and mobility     Problem List Patient Active Problem List    Diagnosis Date Noted  . Closed right tibial fracture 06/07/2015    Lorrene ReidKelly Takacs, PT 07/29/2015 9:15 AM  [facility] [address], [address] [place], [place], [address] Phone: [phone]   Fax:  [phone]  Name: Ryan Finley MRN: [id number] Date of Birth: 30-Nov-1960

## 2015-08-03 ENCOUNTER — Encounter: Payer: Self-pay | Admitting: Physical Therapy

## 2015-08-03 ENCOUNTER — Ambulatory Visit: Payer: Self-pay | Admitting: Physical Therapy

## 2015-08-03 DIAGNOSIS — R2689 Other abnormalities of gait and mobility: Secondary | ICD-10-CM

## 2015-08-03 DIAGNOSIS — M25671 Stiffness of right ankle, not elsewhere classified: Secondary | ICD-10-CM

## 2015-08-03 DIAGNOSIS — M6281 Muscle weakness (generalized): Secondary | ICD-10-CM

## 2015-08-03 DIAGNOSIS — R609 Edema, unspecified: Secondary | ICD-10-CM

## 2015-08-03 DIAGNOSIS — M25561 Pain in right knee: Secondary | ICD-10-CM

## 2015-08-03 DIAGNOSIS — M25661 Stiffness of right knee, not elsewhere classified: Secondary | ICD-10-CM

## 2015-08-03 DIAGNOSIS — M25571 Pain in right ankle and joints of right foot: Secondary | ICD-10-CM

## 2015-08-03 NOTE — Therapy (Signed)
[facility] [address], [address] [place], [place], [address] Phone: [phone]   Fax:  [phone]  Physical Therapy Treatment  Patient Details  Name: Ryan Finley MRN: [id number] Date of Birth: Jun 04, 1960 Referring Provider: Dr. Samson FredericBrian Swinteck  Encounter Date: 08/03/2015      PT End of Session - 08/03/15 0859    Visit Number 11   Date for PT Re-Evaluation 09/25/15   Authorization Type self pay   PT Start Time 0848   PT Stop Time 1000   PT Time Calculation (min) 72 min   Activity Tolerance Patient tolerated treatment well   Behavior During Therapy [facility] for tasks assessed/performed      Past Medical History  Diagnosis Date  . Hypertension     Past Surgical History  Procedure Laterality Date  . Tibia im nail insertion Right 06/06/2015    Procedure: INTRAMEDULLARY (IM) NAIL TIBIAL;  Surgeon: Samson FredericBrian Swinteck, MD;  [facility]: [facility];  Service: Orthopedics;  Laterality: Right;    There were no vitals filed for this visit.      Subjective Assessment - 08/03/15 0900    Subjective Pt frustrated with slow progress. Walking more around the house without the crutch.    Currently in Pain? Yes   Pain Score 6    Pain Location Ankle  Knee 4/10 dull, better with exercise, worse in AM   Pain Orientation Right   Pain Descriptors / Indicators Sore   Aggravating Factors  Upon getting up in the morning & late in day   Pain Relieving Factors Rest, ice, heat   Multiple Pain Sites --  See above                         [facility] Treatment/Exercise - 08/03/15 0001    Knee/Hip Exercises: Aerobic   Nustep L6 x 10 min   Knee/Hip Exercises: Standing   Rebounder weighshifting with neutral hip posture 3 ways x 1 minute each   Knee/Hip Exercises: Seated   Long Arc Quad Strengthening;Right;3 sets;10 reps;Weights   Long Arc Quad Weight 4 lbs.   Vasopneumatic   Number Minutes Vasopneumatic  15 minutes   Vasopnuematic Location  Knee   Vasopneumatic Pressure Medium   Vasopneumatic Temperature  3snowflakes   Ankle Exercises: Stretches   Other Stretch Stair stretch for ankle planatrflexion x 3 min    Ankle Exercises: Seated   BAPS Level 3  15-20x each direction                  PT Short Term Goals - 08/03/15 1019    PT SHORT TERM GOAL #1   Title full mobility of right patella making it easier to flex his right knee past 100 degrees actively   Time 4   Period Weeks   Status On-going  Measurements end of this week.    PT SHORT TERM GOAL #2   Title increased right ankle dorsiflexion >/= 0 degrees due to imporve mobility of right achilles tendon   Time 4   Period Weeks   Status On-going  Measurements at the end of the week.    PT SHORT TERM GOAL #4   Title right knee extension increased >/= -5 degrees due to increased right patella mobility   Time 4   Period Weeks   Status On-going  Measurements at end of the week.            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, hospital   Time 8   Period Weeks   Status On-going   PT LONG TERM GOAL #5   Title drive with right foot due to right ankle Dorsiflexion >/= 15 degrees AROM    Time 8   Period Weeks   Status On-going   Additional Long Term Goals   Additional Long Term Goals Yes   PT LONG TERM GOAL #6   Title FOTO score is </= 47% limitation   Time 8   Period Weeks    Status On-going  64% limitation   PT LONG TERM GOAL #7   Title improve Rt LE strength and ROM to asecend steps with step-over-step gait with use of 1 rail   Time 8   Period Weeks   Status On-going               Plan - 08/03/15 0859    Clinical Impression Statement Pt frustrated with his self perceived slow progress.    Rehab Potential Excellent   Clinical Impairments Affecting Rehab Potential WBAT on Rt LE with 1 crutch   PT Frequency 2x / week   PT Duration 8 weeks   PT Treatment/Interventions Cryotherapy;Electrical Stimulation;Moist Heat;Ultrasound;Therapeutic exercise;Therapeutic activities;Stair training;Gait training;Neuromuscular re-education;Patient/family education;Scar mobilization;Manual techniques;Passive range of motion;Dry needling;Energy conservation;Vasopneumatic Device   PT Next Visit Plan Measure next for goals: knee and ankle   Consulted and Agree with Plan of Care Patient      Patient will benefit from skilled therapeutic intervention in order to improve the following deficits and impairments:  Abnormal gait, Increased fascial restricitons, Pain, Decreased mobility, Decreased scar mobility, Increased muscle spasms, Decreased activity tolerance, Decreased endurance, Decreased range of motion, Decreased strength, Impaired flexibility, Increased edema  Visit Diagnosis: Edema, unspecified  Muscle weakness (generalized)  Stiffness of right knee, not elsewhere classified  Stiffness of right ankle, not elsewhere classified  Pain in right knee  Pain in right ankle and joints of right foot  Other abnormalities of gait and mobility     Problem List Patient Active Problem List   Diagnosis Date Noted  . Closed right tibial fracture 06/07/2015    Ryan Finley, PTA 08/03/2015, 10:21 AM  [facility] [address] [place], [place], [address] Phone: [phone]   Fax:  [phone]  Name:  Ryan Finley MRN: [id number] Date of Birth: May 06, 1960

## 2015-08-05 ENCOUNTER — Ambulatory Visit: Payer: Self-pay | Admitting: Physical Therapy

## 2015-08-05 ENCOUNTER — Encounter: Payer: Self-pay | Admitting: Physical Therapy

## 2015-08-05 DIAGNOSIS — M25661 Stiffness of right knee, not elsewhere classified: Secondary | ICD-10-CM

## 2015-08-05 DIAGNOSIS — M25561 Pain in right knee: Secondary | ICD-10-CM

## 2015-08-05 DIAGNOSIS — R609 Edema, unspecified: Secondary | ICD-10-CM

## 2015-08-05 DIAGNOSIS — M6281 Muscle weakness (generalized): Secondary | ICD-10-CM

## 2015-08-05 DIAGNOSIS — R2689 Other abnormalities of gait and mobility: Secondary | ICD-10-CM

## 2015-08-05 DIAGNOSIS — M25571 Pain in right ankle and joints of right foot: Secondary | ICD-10-CM

## 2015-08-05 DIAGNOSIS — M25671 Stiffness of right ankle, not elsewhere classified: Secondary | ICD-10-CM

## 2015-08-05 NOTE — Therapy (Signed)
[facility] [address] [place], [place], [address] Phone: [phone]   Fax:  [phone]  Physical Therapy Treatment  Patient Details  Name: Ryan Finley MRN: [id number] Date of Birth: 1960-11-29 Referring Provider: Dr. Rod Can  Encounter Date: 08/05/2015      PT End of Session - 08/05/15 0844    Visit Number 12   Date for PT Re-Evaluation 09/25/15   Authorization Type self pay   PT Start Time 0840   PT Stop Time 0945   PT Time Calculation (min) 65 min   Activity Tolerance Patient tolerated treatment well   Behavior During Therapy [facility] for tasks assessed/performed      Past Medical History  Diagnosis Date  . Hypertension     Past Surgical History  Procedure Laterality Date  . Tibia im nail insertion Right 06/06/2015    Procedure: INTRAMEDULLARY (IM) NAIL TIBIAL;  Surgeon: Rod Can, MD;  Location: [place];  Service: Orthopedics;  Laterality: Right;    There were no vitals filed for this visit.      Subjective Assessment - 08/05/15 0845    Currently in Pain? Yes   Pain Score 4    Pain Location Knee  Ankle feels good with the support   Pain Orientation Right   Pain Descriptors / Indicators Aching   Multiple Pain Sites No            [facility] PT Assessment - 08/05/15 0001    AROM   Right Knee Extension 0  This was acheived after stretching: pre stretch 5 degrees.   Right Knee Flexion 125   Right Ankle Dorsiflexion -1   PROM   Right Knee Extension 0   Right Ankle Dorsiflexion 0                     [facility] Adult PT Treatment/Exercise - 08/05/15 0001    Knee/Hip Exercises: Aerobic   Stationary Bike L2 x 10 min   Knee/Hip Exercises: Seated   Long Arc Quad Strengthening;Right;3 sets;10 reps   Long Arc Quad Weight 4 lbs.   Moist Heat Therapy   Number Minutes Moist Heat 10 Minutes   Moist Heat Location Ankle   Vasopneumatic   Number Minutes Vasopneumatic  15 minutes   Vasopnuematic Location  Knee   Vasopneumatic Pressure Medium   Vasopneumatic Temperature  3snowflakes   Manual Therapy   Soft tissue mobilization Lower ankle MFR, gastroc soft tissue work, ankle PROM DF   Ankle Exercises: Stretches   Gastroc Stretch 3 reps;20 seconds   Other Stretch Stair stretch for ankle planatrflexion x 3 min    Other Stretch Towel stretch 3x 30 sec   Ankle Exercises: Seated   BAPS Level 3  15-20x each direction including circles   Ankle Exercises: Supine   T-Band red PF/DF 2x10 each                PT Education - 08/05/15 4970    Education provided Yes   Education Details Red band PF/DF for HEP. Gave band for HEP.    Person(s) Educated Patient   Methods Explanation;Demonstration;Tactile cues;Verbal cues   Comprehension Verbalized understanding;Returned demonstration          PT Short Term Goals - 08/05/15 0913    PT SHORT TERM GOAL #1   Title full mobility of right patella making it easier to flex his right knee past 100 degrees actively   Time 4   Period Weeks  Status Achieved   PT SHORT TERM GOAL #2   Title increased right ankle dorsiflexion >/= 0 degrees due to imporve mobility of right achilles tendon   Time 4   Period Weeks   Status On-going  progressing towards, -1   PT SHORT TERM GOAL #4   Title right knee extension increased >/= -5 degrees due to increased right patella mobility   Time 4   Period Weeks   Status Achieved           PT Long Term Goals - 08/05/15 0919    PT LONG TERM GOAL #3   Title right knee AROM >/= 115 degrees so he is able to go up and down stairs with a step over step pattern   Time 12   Period Weeks   Status Achieved               Plan - 08/05/15 0845    Clinical Impression Statement Pt met all STG this week, and is only 1 degrree off of meeting his first dorsiflexion ROM goal.  Added red band PF/DF ex to HEP today. Continues to wean off his crutch.    Rehab Potential Excellent   Clinical  Impairments Affecting Rehab Potential WBAT on Rt LE with 1 crutch   PT Frequency 2x / week   PT Duration 8 weeks   PT Treatment/Interventions Cryotherapy;Electrical Stimulation;Moist Heat;Ultrasound;Therapeutic exercise;Therapeutic activities;Stair training;Gait training;Neuromuscular re-education;Patient/family education;Scar mobilization;Manual techniques;Passive range of motion;Dry needling;Energy conservation;Vasopneumatic Device   PT Next Visit Plan Continue with ankle ROM and strength, knee & hip strength.    Consulted and Agree with Plan of Care Patient      Patient will benefit from skilled therapeutic intervention in order to improve the following deficits and impairments:  Abnormal gait, Increased fascial restricitons, Pain, Decreased mobility, Decreased scar mobility, Increased muscle spasms, Decreased activity tolerance, Decreased endurance, Decreased range of motion, Decreased strength, Impaired flexibility, Increased edema  Visit Diagnosis: Edema, unspecified  Stiffness of right knee, not elsewhere classified  Muscle weakness (generalized)  Stiffness of right ankle, not elsewhere classified  Pain in right knee  Pain in right ankle and joints of right foot  Other abnormalities of gait and mobility     Problem List Patient Active Problem List   Diagnosis Date Noted  . Closed right tibial fracture 06/07/2015    COCHRAN,JENNIFER, PTA 08/05/2015, 9:27 AM  [facility] [address] [place], [place], [address] Phone: [phone]   Fax:  [phone]  Name: LATAVIOUS BITTER MRN: [id number] Date of Birth: 1960-11-09

## 2015-08-10 ENCOUNTER — Ambulatory Visit: Payer: Self-pay

## 2015-08-10 DIAGNOSIS — R609 Edema, unspecified: Secondary | ICD-10-CM

## 2015-08-10 DIAGNOSIS — M6281 Muscle weakness (generalized): Secondary | ICD-10-CM

## 2015-08-10 DIAGNOSIS — M25561 Pain in right knee: Secondary | ICD-10-CM

## 2015-08-10 DIAGNOSIS — R2689 Other abnormalities of gait and mobility: Secondary | ICD-10-CM

## 2015-08-10 DIAGNOSIS — M25671 Stiffness of right ankle, not elsewhere classified: Secondary | ICD-10-CM

## 2015-08-10 DIAGNOSIS — M25661 Stiffness of right knee, not elsewhere classified: Secondary | ICD-10-CM

## 2015-08-10 NOTE — Therapy (Signed)
[facility] [address], [address] [place], [place], [address] Phone: [phone]   Fax:  [phone]  Physical Therapy Treatment  Patient Details  Name: Ryan Finley MRN: [id number] Date of Birth: [birth year] Referring Provider: Dr. Samson Frederic  Encounter Date: 08/10/2015      PT End of Session - 08/10/15 0923    Visit Number 13   Date for PT Re-Evaluation 09/25/15   PT Start Time 0838   PT Stop Time 0938   PT Time Calculation (min) 60 min   Activity Tolerance Patient tolerated treatment well   Behavior During Therapy [facility] for tasks assessed/performed      Past Medical History  Diagnosis Date  . Hypertension     Past Surgical History  Procedure Laterality Date  . Tibia im nail insertion Right 06/06/2015    Procedure: INTRAMEDULLARY (IM) NAIL TIBIAL;  Surgeon: Samson Frederic, MD;  [facility];  Service: Orthopedics;  Laterality: Right;    There were no vitals filed for this visit.      Subjective Assessment - 08/10/15 0858    Subjective I gave up the crutch over the weeekend.  Pt with improved gait pattern without device.     Currently in Pain? Yes   Pain Score 3    Pain Location Knee   Pain Orientation Right   Pain Descriptors / Indicators Aching   Pain Type Acute pain   Pain Onset More than a month ago   Pain Frequency Intermittent   Aggravating Factors  early in the morning, late in the day   Pain Relieving Factors rest, stretching, ice, heat                         [facility] Adult PT Treatment/Exercise - 08/10/15 0001    Ambulation/Gait   Gait Comments no device.  Minimal gait assymmetry.   Knee/Hip Exercises: Aerobic   Stationary Bike L2 x 10 min   Knee/Hip Exercises: Standing   Rocker Board 3 minutes   Rebounder weighshifting with neutral hip posture 3 ways x 1 minute each   Knee/Hip Exercises: Seated   Long Arc Quad Strengthening;Right;3 sets;10 reps   Long Arc Quad Weight 4  lbs.   Hamstring Curl 2 sets;10 reps;Right    Hamstring Weights 3 lbs.   Moist Heat Therapy   Number Minutes Moist Heat 10 Minutes   Moist Heat Location Ankle   Vasopneumatic   Number Minutes Vasopneumatic  15 minutes   Vasopnuematic Location  Knee   Vasopneumatic Pressure Medium   Vasopneumatic Temperature  3snowflakes   Ankle Exercises: Seated   BAPS Level 3  15-20x each direction including circles                  PT Short Term Goals - 08/10/15 0854    PT SHORT TERM GOAL #2   Title increased right ankle dorsiflexion >/= 0 degrees due to imporve mobility of right achilles tendon   Time 4   Period Weeks   Status On-going           PT Long Term Goals - 08/10/15 2706    PT LONG TERM GOAL #1   Title independent with HEP    Time 12   Period Weeks   Status On-going   PT LONG TERM GOAL #2   Title ambulate with no assistive device with minimal gait deficits and full weight on right leg   Time 12  Period Weeks   Status On-going  inconsistent   PT LONG TERM GOAL #3   Title right knee AROM >/= 115 degrees so he is able to go up and down stairs with a step over step pattern   Status Achieved   PT LONG TERM GOAL #5   Title drive with right foot due to right ankle Dorsiflexion >/= 15 degrees AROM    Time 8   Period Weeks   Status On-going   PT LONG TERM GOAL #7   Title improve Rt LE strength and ROM to asecend steps with step-over-step gait with use of 1 rail   Time 8   Period Weeks   Status On-going               Plan - 08/10/15 0859    Clinical Impression Statement Pt is not using crutches today and demonstrates minimal gait assymmetry.  Pt with improved gait pattern with heel strike and equal stance time.  Pt with difficulty with ascending and descending steps due to weakness and reduced ankle AROM.  Pt able to walk ~10 minutes in the community.  Pt will continue to benefit from skilled PT for strength, AROM, gait, stair training and edema  management.     Rehab Potential Excellent   Clinical Impairments Affecting Rehab Potential WBAT on Rt LE with 1 crutch   PT Frequency 2x / week   PT Duration 8 weeks   PT Treatment/Interventions Cryotherapy;Electrical Stimulation;Moist Heat;Ultrasound;Therapeutic exercise;Therapeutic activities;Stair training;Gait training;Neuromuscular re-education;Patient/family education;Scar mobilization;Manual techniques;Passive range of motion;Dry needling;Energy conservation;Vasopneumatic Device   PT Next Visit Plan Continue with ankle ROM and strength, knee & hip strength.    Consulted and Agree with Plan of Care Patient      Patient will benefit from skilled therapeutic intervention in order to improve the following deficits and impairments:  Abnormal gait, Increased fascial restricitons, Pain, Decreased mobility, Decreased scar mobility, Increased muscle spasms, Decreased activity tolerance, Decreased endurance, Decreased range of motion, Decreased strength, Impaired flexibility, Increased edema  Visit Diagnosis: Edema, unspecified  Stiffness of right knee, not elsewhere classified  Muscle weakness (generalized)  Stiffness of right ankle, not elsewhere classified  Pain in right knee  Other abnormalities of gait and mobility     Problem List Patient Active Problem List   Diagnosis Date Noted  . Closed right tibial fracture 06/07/2015     Lorrene ReidKelly Haydn Hutsell, PT 08/10/2015 9:24 AM  [facility] [address], [address] [place], [place], [address] Phone: [phone]   Fax:  [phone]  Name: Clide CliffJohn M Bayona MRN: [id number] Date of Birth: 1960/07/30

## 2015-08-12 ENCOUNTER — Ambulatory Visit: Payer: Self-pay

## 2015-08-12 DIAGNOSIS — R2689 Other abnormalities of gait and mobility: Secondary | ICD-10-CM

## 2015-08-12 DIAGNOSIS — M25571 Pain in right ankle and joints of right foot: Secondary | ICD-10-CM

## 2015-08-12 DIAGNOSIS — M25671 Stiffness of right ankle, not elsewhere classified: Secondary | ICD-10-CM

## 2015-08-12 DIAGNOSIS — M6281 Muscle weakness (generalized): Secondary | ICD-10-CM

## 2015-08-12 DIAGNOSIS — R609 Edema, unspecified: Secondary | ICD-10-CM

## 2015-08-12 DIAGNOSIS — M25661 Stiffness of right knee, not elsewhere classified: Secondary | ICD-10-CM

## 2015-08-12 DIAGNOSIS — M25561 Pain in right knee: Secondary | ICD-10-CM

## 2015-08-12 NOTE — Therapy (Signed)
[facility] [address], [address] [place], [place], [address] Phone: [phone]   Fax:  [phone]  Physical Therapy Treatment  Patient Details  Name: Ryan Finley MRN: [id number] Date of Birth: [birth year] Referring Provider: Dr. Samson FredericBrian Swinteck  Encounter Date: 08/12/2015      PT End of Session - 08/12/15 0916    Visit Number 14   Date for PT Re-Evaluation 09/25/15   PT Start Time 0834   PT Stop Time 0936   PT Time Calculation (min) 62 min      Past Medical History  Diagnosis Date  . Hypertension     Past Surgical History  Procedure Laterality Date  . Tibia im nail insertion Right 06/06/2015    Procedure: INTRAMEDULLARY (IM) NAIL TIBIAL;  Surgeon: Samson FredericBrian Swinteck, MD;  [facility];  Service: Orthopedics;  Laterality: Right;    There were no vitals filed for this visit.                       [facility] Adult PT Treatment/Exercise - 08/12/15 0001    Ambulation/Gait   Gait Comments no device.  Minimal gait assymmetry.   Knee/Hip Exercises: Aerobic   Stationary Bike L2 x 10 min   Tread Mill 1.2 mph with use of mirror to help with gait pattern and neutral hip x 5 minutes   Knee/Hip Exercises: Standing   Rocker Board 3 minutes   Rebounder weighshifting with neutral hip posture 3 ways x 1 minute each   Knee/Hip Exercises: Seated   Long Arc Quad Strengthening;Right;3 sets;15 reps   Long Arc Quad Weight 5 lbs.   Hamstring Curl 2 sets;10 reps;Right    Hamstring Weights 3 lbs.   Moist Heat Therapy   Number Minutes Moist Heat 10 Minutes   Moist Heat Location Ankle   Vasopneumatic   Number Minutes Vasopneumatic  15 minutes   Vasopnuematic Location  Knee   Vasopneumatic Pressure Medium   Vasopneumatic Temperature  3snowflakes   Ankle Exercises: Seated   BAPS Level 4  15-20x each direction including circles   Ankle Exercises: Stretches   Other Stretch Stair stretch for ankle planatrflexion x 3 min                    PT Short Term Goals - 08/10/15 0854    PT SHORT TERM GOAL #2   Title increased right ankle dorsiflexion >/= 0 degrees due to imporve mobility of right achilles tendon   Time 4   Period Weeks   Status On-going           PT Long Term Goals - 08/10/15 0855    PT LONG TERM GOAL #1   Title independent with HEP    Time 12   Period Weeks   Status On-going   PT LONG TERM GOAL #2   Title ambulate with no assistive device with minimal gait deficits and full weight on right leg   Time 12   Period Weeks   Status On-going  inconsistent   PT LONG TERM GOAL #3   Title right knee AROM >/= 115 degrees so he is able to go up and down stairs with a step over step pattern   Status Achieved   PT LONG TERM GOAL #5   Title drive with right foot due to right ankle Dorsiflexion >/= 15 degrees AROM    Time 8   Period Weeks   Status On-going   PT  LONG TERM GOAL #7   Title improve Rt LE strength and ROM to asecend steps with step-over-step gait with use of 1 rail   Time 8   Period Weeks   Status On-going               Plan - 08/12/15 1610    Clinical Impression Statement Pt has not been using crutches for about a week.  He demonstrates minimal gait assymmetry.  Pt with improved gait pattern with heel strik and equal stance time.  Pt with difficulty with ascending and descending steps due to weakness and reduced ankle AROM.  Pt is able to walk ~10 minutes int he community.  Pt will continue to benefit from skilled PT for strength, AROM, gait, stair training and edema management.     Rehab Potential Excellent   Clinical Impairments Affecting Rehab Potential WBAT on Rt LE with 1 crutch   PT Frequency 2x / week   PT Duration 8 weeks   PT Treatment/Interventions Cryotherapy;Electrical Stimulation;Moist Heat;Ultrasound;Therapeutic exercise;Therapeutic activities;Stair training;Gait training;Neuromuscular re-education;Patient/family education;Scar mobilization;Manual  techniques;Passive range of motion;Dry needling;Energy conservation;Vasopneumatic Device   PT Next Visit Plan Continue with ankle ROM and strength, knee & hip strength.    Consulted and Agree with Plan of Care Patient      Patient will benefit from skilled therapeutic intervention in order to improve the following deficits and impairments:  Abnormal gait, Increased fascial restricitons, Pain, Decreased mobility, Decreased scar mobility, Increased muscle spasms, Decreased activity tolerance, Decreased endurance, Decreased range of motion, Decreased strength, Impaired flexibility, Increased edema  Visit Diagnosis: Edema, unspecified  Stiffness of right knee, not elsewhere classified  Muscle weakness (generalized)  Stiffness of right ankle, not elsewhere classified  Pain in right knee  Other abnormalities of gait and mobility  Pain in right ankle and joints of right foot     Problem List Patient Active Problem List   Diagnosis Date Noted  . Closed right tibial fracture 06/07/2015     Lorrene Reid, PT 08/12/2015 9:18 AM  [facility] [address] [place], [place], [address] Phone: [phone]   Fax:  [phone]  Name: Ryan Finley MRN: [id number] Date of Birth: [birth year]

## 2015-08-17 ENCOUNTER — Ambulatory Visit: Payer: Self-pay | Admitting: Physical Therapy

## 2015-08-17 ENCOUNTER — Encounter: Payer: Self-pay | Admitting: Physical Therapy

## 2015-08-17 DIAGNOSIS — R609 Edema, unspecified: Secondary | ICD-10-CM

## 2015-08-17 DIAGNOSIS — R2689 Other abnormalities of gait and mobility: Secondary | ICD-10-CM

## 2015-08-17 DIAGNOSIS — M25671 Stiffness of right ankle, not elsewhere classified: Secondary | ICD-10-CM

## 2015-08-17 DIAGNOSIS — M6281 Muscle weakness (generalized): Secondary | ICD-10-CM

## 2015-08-17 DIAGNOSIS — M25561 Pain in right knee: Secondary | ICD-10-CM

## 2015-08-17 DIAGNOSIS — M25661 Stiffness of right knee, not elsewhere classified: Secondary | ICD-10-CM

## 2015-08-17 NOTE — Therapy (Signed)
[facility] [address], [address] [place], [place], [address] Phone: [phone]   Fax:  [phone]  Physical Therapy Treatment  Patient Details  Name: Ryan Finley MRN: [id number] Date of Birth: 10/17/1960 Referring Provider: Dr. Samson Frederic  Encounter Date: 08/17/2015      PT End of Session - 08/17/15 0853    Visit Number 15   Date for PT Re-Evaluation 09/25/15   Authorization Type self pay   PT Start Time 0835   PT Stop Time 0946   PT Time Calculation (min) 71 min   Activity Tolerance Patient tolerated treatment well   Behavior During Therapy [facility] for tasks assessed/performed      Past Medical History:  Diagnosis Date  . Hypertension     Past Surgical History:  Procedure Laterality Date  . TIBIA IM NAIL INSERTION Right 06/06/2015   Procedure: INTRAMEDULLARY (IM) NAIL TIBIAL;  Surgeon: Samson Frederic, MD;  [facility];  Service: Orthopedics;  Laterality: Right;    There were no vitals filed for this visit.      Subjective Assessment - 08/17/15 0850    Subjective I am really sore this AM. I am sore all over from just doing a lot.    Currently in Pain? Yes   Pain Score 3    Pain Location --  Knee and ankle   Pain Orientation Right   Pain Descriptors / Indicators Dull;Aching   Aggravating Factors  Just overdoing it   Pain Relieving Factors Stretching   Multiple Pain Sites No  See above            [facility] PT Assessment - 08/17/15 0001      AROM   Right Ankle Dorsiflexion -4                     [facility] Adult PT Treatment/Exercise - 08/17/15 0001      Knee/Hip Exercises: Aerobic   Stationary Bike L2 x 10 min   Tread Mill 1.73mph-1.7mph LTUE with mirror x 10 min     Knee/Hip Exercises: Machines for Strengthening   Total Gym Leg Press Seat #6 bil 65# 20x, RTLE 40# 10x  VC for getting heel down on platform for ankle stretch     Vasopneumatic   Number Minutes Vasopneumatic  15  minutes   Vasopnuematic Location  Ankle   Vasopneumatic Pressure Medium   Vasopneumatic Temperature  3snowflakes     Ankle Exercises: Stretches   Restaurant manager, fast food --   3 min   Other Stretch Stair stretch for ankle planatrflexion x 20   Other Stretch Towel stretch 3x 30 sec     Ankle Exercises: Seated   BAPS Level 4  15-20x each direction including circles                  PT Short Term Goals - 08/17/15 9166      PT SHORT TERM GOAL #2   Title increased right ankle dorsiflexion >/= 0 degrees due to imporve mobility of right achilles tendon   Time 4   Period Weeks   Status On-going  -4 today           PT Long Term Goals - 08/10/15 0855      PT LONG TERM GOAL #1   Title independent with HEP    Time 12   Period Weeks   Status On-going     PT LONG TERM GOAL #2   Title ambulate with  no assistive device with minimal gait deficits and full weight on right leg   Time 12   Period Weeks   Status On-going  inconsistent     PT LONG TERM GOAL #3   Title right knee AROM >/= 115 degrees so he is able to go up and down stairs with a step over step pattern   Status Achieved     PT LONG TERM GOAL #5   Title drive with right foot due to right ankle Dorsiflexion >/= 15 degrees AROM    Time 8   Period Weeks   Status On-going     PT LONG TERM GOAL #7   Title improve Rt LE strength and ROM to asecend steps with step-over-step gait with use of 1 rail   Time 8   Period Weeks   Status On-going               Plan - 08/17/15 0857    Clinical Impression Statement Pt was pretty sore from his weekend activites and possibly more swollen in his ankle today. His dorsiflexion did not measure better most likely from this. Continues to work on his gait, better heel strike. Pt was put on the leg press today for his knee strength and ankle streching.    Rehab Potential Excellent   Clinical Impairments Affecting Rehab Potential WBAT on Rt LE with 1 crutch   PT Frequency  2x / week   PT Duration 8 weeks   PT Treatment/Interventions Cryotherapy;Electrical Stimulation;Moist Heat;Ultrasound;Therapeutic exercise;Therapeutic activities;Stair training;Gait training;Neuromuscular re-education;Patient/family education;Scar mobilization;Manual techniques;Passive range of motion;Dry needling;Energy conservation;Vasopneumatic Device   PT Next Visit Plan Continue with ankle ROM and strength, knee & hip strength.    Consulted and Agree with Plan of Care Patient      Patient will benefit from skilled therapeutic intervention in order to improve the following deficits and impairments:  Abnormal gait, Increased fascial restricitons, Pain, Decreased mobility, Decreased scar mobility, Increased muscle spasms, Decreased activity tolerance, Decreased endurance, Decreased range of motion, Decreased strength, Impaired flexibility, Increased edema  Visit Diagnosis: Edema, unspecified  Stiffness of right knee, not elsewhere classified  Muscle weakness (generalized)  Stiffness of right ankle, not elsewhere classified  Pain in right knee  Other abnormalities of gait and mobility     Problem List Patient Active Problem List   Diagnosis Date Noted  . Closed right tibial fracture 06/07/2015    Ryan Finley 08/17/2015, 9:38 AM  [name] [address], [address] [place], [place], [address] Phone: [phone]   Fax:  [phone]  Name: Ryan Finley MRN: [id number] Date of Birth: August 12, 1960

## 2015-08-19 ENCOUNTER — Ambulatory Visit: Payer: Self-pay | Admitting: Physical Therapy

## 2015-08-19 ENCOUNTER — Encounter: Payer: Self-pay | Admitting: Physical Therapy

## 2015-08-19 DIAGNOSIS — M25561 Pain in right knee: Secondary | ICD-10-CM

## 2015-08-19 DIAGNOSIS — R609 Edema, unspecified: Secondary | ICD-10-CM

## 2015-08-19 DIAGNOSIS — M25661 Stiffness of right knee, not elsewhere classified: Secondary | ICD-10-CM

## 2015-08-19 DIAGNOSIS — M25571 Pain in right ankle and joints of right foot: Secondary | ICD-10-CM

## 2015-08-19 DIAGNOSIS — M6281 Muscle weakness (generalized): Secondary | ICD-10-CM

## 2015-08-19 DIAGNOSIS — M25671 Stiffness of right ankle, not elsewhere classified: Secondary | ICD-10-CM

## 2015-08-19 DIAGNOSIS — R2689 Other abnormalities of gait and mobility: Secondary | ICD-10-CM

## 2015-08-19 NOTE — Therapy (Signed)
[facility] [address], [address] [place], [place], [address] Phone: [phone]   Fax:  [phone]  Physical Therapy Treatment  Patient Details  Name: Ryan Finley MRN: [id number] Date of Birth: [birth year] Referring Provider: Dr. Samson Frederic  Encounter Date: 08/19/2015      PT End of Session - 08/19/15 0857    Visit Number 16   Date for PT Re-Evaluation 09/25/15   Authorization Type self pay   PT Start Time 0830   PT Stop Time 0945   PT Time Calculation (min) 75 min   Activity Tolerance Patient tolerated treatment well   Behavior During Therapy [facility] for tasks assessed/performed      Past Medical History:  Diagnosis Date  . Hypertension     Past Surgical History:  Procedure Laterality Date  . TIBIA IM NAIL INSERTION Right 06/06/2015   Procedure: INTRAMEDULLARY (IM) NAIL TIBIAL;  Surgeon: Samson Frederic, MD;  [facility]: [facility];  Service: Orthopedics;  Laterality: Right;    There were no vitals filed for this visit.      Subjective Assessment - 08/19/15 0856    Subjective I had a pop yesterday in my ankle. No increased pain, reports feeling like it was looser after this pop.    Currently in Pain? Yes   Pain Score 3    Pain Location Ankle   Pain Orientation Right   Pain Descriptors / Indicators Dull   Multiple Pain Sites No                         [facility] Adult PT Treatment/Exercise - 08/19/15 0001      Knee/Hip Exercises: Aerobic   Stationary Bike L2 x 10 min     Knee/Hip Exercises: Machines for Strengthening   Total Gym Leg Press seat #6 Bil 70# 30x, RTLE 40# 2x10     Knee/Hip Exercises: Standing   Walking with Sports Cord 25# forward/backward 10x each     Knee/Hip Exercises: Seated   Long Arc Quad Strengthening;Right;3 sets  3 x10 6#     Vasopneumatic   Number Minutes Vasopneumatic  15 minutes   Vasopnuematic Location  Ankle   Vasopneumatic Pressure Medium   Vasopneumatic  Temperature  3snowflakes     Ankle Exercises: Seated   BAPS Sitting;Level 4  20x each                  PT Short Term Goals - 08/17/15 6962      PT SHORT TERM GOAL #2   Title increased right ankle dorsiflexion >/= 0 degrees due to imporve mobility of right achilles tendon   Time 4   Period Weeks   Status On-going  -4 today           PT Long Term Goals - 08/10/15 0855      PT LONG TERM GOAL #1   Title independent with HEP    Time 12   Period Weeks   Status On-going     PT LONG TERM GOAL #2   Title ambulate with no assistive device with minimal gait deficits and full weight on right leg   Time 12   Period Weeks   Status On-going  inconsistent     PT LONG TERM GOAL #3   Title right knee AROM >/= 115 degrees so he is able to go up and down stairs with a step over step pattern   Status Achieved  PT LONG TERM GOAL #5   Title drive with right foot due to right ankle Dorsiflexion >/= 15 degrees AROM    Time 8   Period Weeks   Status On-going     PT LONG TERM GOAL #7   Title improve Rt LE strength and ROM to asecend steps with step-over-step gait with use of 1 rail   Time 8   Period Weeks   Status On-going               Plan - 08/19/15 0924    Clinical Impression Statement Soreness from Monday has reduced. Introduced resisted walking today to work on dynamic stability and continued normalizing of his gait.     Rehab Potential Excellent   Clinical Impairments Affecting Rehab Potential WBAT on Rt LE with 1 crutch   PT Frequency 2x / week   PT Duration 8 weeks   PT Treatment/Interventions Cryotherapy;Electrical Stimulation;Moist Heat;Ultrasound;Therapeutic exercise;Therapeutic activities;Stair training;Gait training;Neuromuscular re-education;Patient/family education;Scar mobilization;Manual techniques;Passive range of motion;Dry needling;Energy conservation;Vasopneumatic Device   PT Next Visit Plan Continue with ankle ROM and strength, knee & hip  strength.    Consulted and Agree with Plan of Care Patient      Patient will benefit from skilled therapeutic intervention in order to improve the following deficits and impairments:  Abnormal gait, Increased fascial restricitons, Pain, Decreased mobility, Decreased scar mobility, Increased muscle spasms, Decreased activity tolerance, Decreased endurance, Decreased range of motion, Decreased strength, Impaired flexibility, Increased edema  Visit Diagnosis: Edema, unspecified  Stiffness of right knee, not elsewhere classified  Muscle weakness (generalized)  Stiffness of right ankle, not elsewhere classified  Pain in right knee  Other abnormalities of gait and mobility  Pain in right ankle and joints of right foot     Problem List Patient Active Problem List   Diagnosis Date Noted  . Closed right tibial fracture 06/07/2015    COCHRAN,JENNIFER, PTA 08/19/2015, 9:26 AM  [facility] [address], [address] [place], [place], [address] Phone: [phone]   Fax:  [phone]  Name: Ryan Finley MRN: [id number] Date of Birth: 1960-05-22

## 2015-08-24 ENCOUNTER — Ambulatory Visit: Payer: Self-pay | Admitting: Physical Therapy

## 2015-08-26 ENCOUNTER — Encounter: Payer: Self-pay | Admitting: Physical Therapy

## 2015-08-31 ENCOUNTER — Encounter: Payer: Self-pay | Admitting: Physical Therapy

## 2015-08-31 ENCOUNTER — Ambulatory Visit: Payer: Self-pay | Attending: Orthopedic Surgery | Admitting: Physical Therapy

## 2015-08-31 DIAGNOSIS — R2689 Other abnormalities of gait and mobility: Secondary | ICD-10-CM | POA: Insufficient documentation

## 2015-08-31 DIAGNOSIS — M25571 Pain in right ankle and joints of right foot: Secondary | ICD-10-CM | POA: Insufficient documentation

## 2015-08-31 DIAGNOSIS — M6281 Muscle weakness (generalized): Secondary | ICD-10-CM | POA: Insufficient documentation

## 2015-08-31 DIAGNOSIS — M25661 Stiffness of right knee, not elsewhere classified: Secondary | ICD-10-CM | POA: Insufficient documentation

## 2015-08-31 DIAGNOSIS — R609 Edema, unspecified: Secondary | ICD-10-CM | POA: Insufficient documentation

## 2015-08-31 DIAGNOSIS — M25671 Stiffness of right ankle, not elsewhere classified: Secondary | ICD-10-CM | POA: Insufficient documentation

## 2015-08-31 DIAGNOSIS — M25561 Pain in right knee: Secondary | ICD-10-CM | POA: Insufficient documentation

## 2015-08-31 NOTE — Therapy (Signed)
[facility] [address], [address] [place], [place], [address] Phone: [phone]   Fax:  [phone]  Physical Therapy Treatment  Patient Details  Name: Ryan Finley MRN: [id number] Date of Birth: 09-Aug-1960 Referring Provider: Dr. Samson FredericBrian Swinteck  Encounter Date: 08/31/2015      PT End of Session - 08/31/15 0937    Visit Number 17   Date for PT Re-Evaluation 09/25/15   Authorization Type self pay   PT Start Time 0932   PT Stop Time 1025   PT Time Calculation (min) 53 min   Activity Tolerance Patient tolerated treatment well   Behavior [facility] for tasks assessed/performed      Past Medical History:  Diagnosis Date  . Hypertension     Past Surgical History:  Procedure Laterality Date  . TIBIA IM NAIL INSERTION Right 06/06/2015   Procedure: INTRAMEDULLARY (IM) NAIL TIBIAL;  Surgeon: Samson FredericBrian Swinteck, MD;  Location: [facility];  Service: Orthopedics;  Laterality: Right;    There were no vitals filed for this visit.      Subjective Assessment - 08/31/15 0936    Subjective I have been working the last few days: climbing ladders, carrying ladders, etc. Muscles feel sore, better in 1-2 days.    Currently in Pain? Yes   Pain Score 2    Pain Location Ankle   Pain Orientation Right   Pain Descriptors / Indicators Sore   Aggravating Factors  Overdoing it   Pain Relieving Factors ice   Multiple Pain Sites No            [facility] PT Assessment - 08/31/15 0001      AROM   Right Ankle Dorsiflexion 4     Strength   Right/Left Ankle --  RT ankle: Plantarflexion 4+/5, Dorsiflexion 4+/5, INV/EV 4+/                     [facility] Adult PT Treatment/Exercise - 08/31/15 0001      Knee/Hip Exercises: Aerobic   Elliptical L1 x 6 min     Knee/Hip Exercises: Machines for Strengthening   Total Gym Leg Press Seat #5 bil 85# 3x10     Vasopneumatic   Number Minutes Vasopneumatic  15 minutes   Vasopnuematic  Location  Ankle   Vasopneumatic Pressure Medium   Vasopneumatic Temperature  3 snowflakes     Ankle Exercises: Stretches   Slant Board Stretch --  3 min stretches     Ankle Exercises: Standing   Other Standing Ankle Exercises resisted walking 25# 10x each direction                  PT Short Term Goals - 08/17/15 [id number]      PT SHORT TERM GOAL #2   Title increased right ankle dorsiflexion >/= 0 degrees due to imporve mobility of right achilles tendon   Time 4   Period Weeks   Status On-going  -4 today           PT Long Term Goals - 08/10/15 0855      PT LONG TERM GOAL #1   Title independent with HEP    Time 12   Period Weeks   Status On-going     PT LONG TERM GOAL #2   Title ambulate with no assistive device with minimal gait deficits and full weight on right leg   Time 12   Period Weeks   Status On-going  inconsistent  PT LONG TERM GOAL #3   Title right knee AROM >/= 115 degrees so he is able to go up and down stairs with a step over step pattern   Status Achieved     PT LONG TERM GOAL #5   Title drive with right foot due to right ankle Dorsiflexion >/= 15 degrees AROM    Time 8   Period Weeks   Status On-going     PT LONG TERM GOAL #7   Title improve Rt LE strength and ROM to asecend steps with step-over-step gait with use of 1 rail   Time 8   Period Weeks   Status On-going               Plan - 08/31/15 0956    Clinical Impression Statement back to climbing & carrying ladders. Pt was sore x 2-3 days but it got better. ROM: see chart   Rehab Potential Excellent   Clinical Impairments Affecting Rehab Potential WBAT on Rt LE with 1 crutch   PT Frequency 2x / week   PT Duration 8 weeks   PT Treatment/Interventions Cryotherapy;Electrical Stimulation;Moist Heat;Ultrasound;Therapeutic exercise;Therapeutic activities;Stair training;Gait training;Neuromuscular re-education;Patient/family education;Scar mobilization;Manual techniques;Passive  range of motion;Dry needling;Energy conservation;Vasopneumatic Device   PT Next Visit Plan To MD tomorrow   Consulted and Agree with Plan of Care Patient      Patient will benefit from skilled therapeutic intervention in order to improve the following deficits and impairments:  Abnormal gait, Increased fascial restricitons, Pain, Decreased mobility, Decreased scar mobility, Increased muscle spasms, Decreased activity tolerance, Decreased endurance, Decreased range of motion, Decreased strength, Impaired flexibility, Increased edema  Visit Diagnosis: Edema, unspecified  Stiffness of right knee, not elsewhere classified  Muscle weakness (generalized)  Stiffness of right ankle, not elsewhere classified  Pain in right knee  Other abnormalities of gait and mobility  Pain in right ankle and joints of right foot     Problem List Patient Active Problem List   Diagnosis Date Noted  . Closed right tibial fracture 06/07/2015    Rosevelt Luu, PTA 08/31/2015, 10:10 AM  [facility] [address], [address] [place], [place], [address] Phone: [phone]   Fax:  [phone]  Name: Ryan Finley MRN: [id number] Date of Birth: 06/18/1960

## 2015-09-02 ENCOUNTER — Ambulatory Visit: Payer: Self-pay

## 2015-09-02 DIAGNOSIS — R2689 Other abnormalities of gait and mobility: Secondary | ICD-10-CM

## 2015-09-02 DIAGNOSIS — M25661 Stiffness of right knee, not elsewhere classified: Secondary | ICD-10-CM

## 2015-09-02 DIAGNOSIS — M25571 Pain in right ankle and joints of right foot: Secondary | ICD-10-CM

## 2015-09-02 DIAGNOSIS — M25561 Pain in right knee: Secondary | ICD-10-CM

## 2015-09-02 DIAGNOSIS — M6281 Muscle weakness (generalized): Secondary | ICD-10-CM

## 2015-09-02 DIAGNOSIS — R609 Edema, unspecified: Secondary | ICD-10-CM

## 2015-09-02 DIAGNOSIS — M25671 Stiffness of right ankle, not elsewhere classified: Secondary | ICD-10-CM

## 2015-09-02 NOTE — Therapy (Addendum)
[facility] [address] [place], [place], [address] Phone: [phone]   Fax:  [phone]  Physical Therapy Treatment  Patient Details  Name: Ryan Finley MRN: [id number] Date of Birth: 23-Nov-1960 Referring Provider: Dr. Rod Can  Encounter Date: 09/02/2015      PT End of Session - 09/02/15 0842    Visit Number 18   Date for PT Re-Evaluation 09/25/15   PT Start Time 0800   PT Stop Time 0902   PT Time Calculation (min) 62 min   Activity Tolerance Patient tolerated treatment well   Behavior During Therapy [facility] for tasks assessed/performed      Past Medical History:  Diagnosis Date  . Hypertension     Past Surgical History:  Procedure Laterality Date  . TIBIA IM NAIL INSERTION Right 06/06/2015   Procedure: INTRAMEDULLARY (IM) NAIL TIBIAL;  Surgeon: Rod Can, MD;  Location: [place];  Service: Orthopedics;  Laterality: Right;    There were no vitals filed for this visit.      Subjective Assessment - 09/02/15 0807    Subjective Saw MD yesterday. No restrictions per pt report.     Pertinent History No restrictions per MD visit on 09/01/15   Currently in Pain? Yes   Pain Score 2    Pain Location Ankle   Pain Orientation Right   Pain Descriptors / Indicators Sore   Pain Type Acute pain   Pain Onset More than a month ago   Pain Frequency Intermittent   Aggravating Factors  overdoing it   Pain Relieving Factors ice, rest            [facility] PT Assessment - 09/02/15 0001      Assessment   Medical Diagnosis closed displaced spiral fracture of right tibia with routine healing S82.241D   Onset Date/Surgical Date 06/06/15     Precautions   Precaution Comments WBAT     Prior Function   Level of Independence Independent   Vocation Full time employment   Vocation Requirements climb ladders, standing, lift 50#     Cognition   Overall Cognitive Status Within Functional Limits for tasks assessed      Observation/Other Assessments   Focus on Therapeutic Outcomes (FOTO)  42% limitation                     [facility] Adult PT Treatment/Exercise - 09/02/15 0001      Knee/Hip Exercises: Aerobic   Stationary Bike L2 x 10 min   Tread Mill reverse: 0.7 mph x 5 minutes     Knee/Hip Exercises: Machines for Strengthening   Total Gym Leg Press Seat #5 bil 85# 3x10, 50# Rt only 3x10     Knee/Hip Exercises: Standing   Forward Step Up Right;20 reps;Hand Hold: 1  Bosu ball   Rocker Board 3 minutes     Vasopneumatic   Number Minutes Vasopneumatic  15 minutes   Vasopnuematic Location  Ankle   Vasopneumatic Pressure Medium   Vasopneumatic Temperature  3 snowflakes     Ankle Exercises: Standing   Other Standing Ankle Exercises resisted walking 30# 10x each direction                  PT Short Term Goals - 08/17/15 6283      PT SHORT TERM GOAL #2   Title increased right ankle dorsiflexion >/= 0 degrees due to imporve mobility of right achilles tendon   Time 4   Period  Weeks   Status On-going  -4 today           PT Long Term Goals - 09/02/15 0813      PT LONG TERM GOAL #2   Title ambulate with no assistive device with minimal gait deficits and full weight on right leg   Status Achieved     PT LONG TERM GOAL #6   Title FOTO score is </= 47% limitation   Status Achieved  42% limitation     PT LONG TERM GOAL #7   Title improve Rt LE strength and ROM to asecend steps with step-over-step gait with use of 1 rail   Status Achieved               Plan - 09/02/15 0814    Clinical Impression Statement Pt is continuing to improve with improved gait pattern and able to ascend steps with step-over-step gait.  FOTO is improved to 42% limitation (74% at evaluation).  Pt worked a few days last week and was able to climb and carry ladders.  Pt will benefit from skilled PT for strength, gait, AROM and edema management.     Rehab Potential Excellent   Clinical  Impairments Affecting Rehab Potential WBAT/FWB   PT Frequency 2x / week   PT Duration 8 weeks   PT Treatment/Interventions Cryotherapy;Electrical Stimulation;Moist Heat;Ultrasound;Therapeutic exercise;Therapeutic activities;Stair training;Gait training;Neuromuscular re-education;Patient/family education;Scar mobilization;Manual techniques;Passive range of motion;Dry needling;Energy conservation;Vasopneumatic Device   PT Next Visit Plan Pt will likely reduce to 1x/wk.  Focus on gait, strength, AROM   Consulted and Agree with Plan of Care Patient      Patient will benefit from skilled therapeutic intervention in order to improve the following deficits and impairments:  Abnormal gait, Increased fascial restricitons, Pain, Decreased mobility, Decreased scar mobility, Increased muscle spasms, Decreased activity tolerance, Decreased endurance, Decreased range of motion, Decreased strength, Impaired flexibility, Increased edema  Visit Diagnosis: Edema, unspecified  Stiffness of right knee, not elsewhere classified  Muscle weakness (generalized)  Stiffness of right ankle, not elsewhere classified  Pain in right knee  Other abnormalities of gait and mobility  Pain in right ankle and joints of right foot     Problem List Patient Active Problem List   Diagnosis Date Noted  . Closed right tibial fracture 06/07/2015    Sigurd Sos, PT 09/02/15 8:44 AM PHYSICAL THERAPY DISCHARGE SUMMARY  Visits from Start of Care: 18  Current functional level related to goals / functional outcomes: See above for current status.  Pt called 09/03/15 to request D/C to [facility] as he plans to return to work.   Remaining deficits: See above   Education / Equipment: HEP Plan: Patient agrees to discharge.  Patient goals were partially met. Patient is being discharged due to being pleased with the current functional level.  ?????        Sigurd Sos, PT 09/03/15 9:35 AM    [facility] [address], [place], [place], [address] Phone: [phone]   Fax:  [phone]  Name: Ryan Finley MRN: [id number] Date of Birth: 1960/11/25

## 2015-09-09 ENCOUNTER — Encounter: Payer: Self-pay | Admitting: Physical Therapy

## 2015-09-16 ENCOUNTER — Encounter: Payer: Self-pay | Admitting: Physical Therapy

## 2015-09-23 ENCOUNTER — Ambulatory Visit: Payer: Self-pay

## 2017-09-20 ENCOUNTER — Observation Stay (HOSPITAL_COMMUNITY)
Admission: EM | Admit: 2017-09-20 | Discharge: 2017-09-22 | Disposition: A | Discharge status: Home / Self Care | Payer: Self-pay | Attending: Internal Medicine | Admitting: Internal Medicine

## 2017-09-20 ENCOUNTER — Other Ambulatory Visit: Payer: Self-pay

## 2017-09-20 ENCOUNTER — Encounter (HOSPITAL_COMMUNITY): Payer: Self-pay

## 2017-09-20 ENCOUNTER — Emergency Department (HOSPITAL_COMMUNITY): Payer: Self-pay

## 2017-09-20 DIAGNOSIS — K529 Noninfective gastroenteritis and colitis, unspecified: Principal | ICD-10-CM | POA: Insufficient documentation

## 2017-09-20 DIAGNOSIS — Z7982 Long term (current) use of aspirin: Secondary | ICD-10-CM | POA: Insufficient documentation

## 2017-09-20 DIAGNOSIS — E871 Hypo-osmolality and hyponatremia: Secondary | ICD-10-CM | POA: Insufficient documentation

## 2017-09-20 DIAGNOSIS — Z8249 Family history of ischemic heart disease and other diseases of the circulatory system: Secondary | ICD-10-CM | POA: Insufficient documentation

## 2017-09-20 DIAGNOSIS — R748 Abnormal levels of other serum enzymes: Secondary | ICD-10-CM | POA: Insufficient documentation

## 2017-09-20 DIAGNOSIS — R7989 Other specified abnormal findings of blood chemistry: Secondary | ICD-10-CM | POA: Diagnosis present

## 2017-09-20 DIAGNOSIS — D649 Anemia, unspecified: Secondary | ICD-10-CM | POA: Insufficient documentation

## 2017-09-20 DIAGNOSIS — R197 Diarrhea, unspecified: Secondary | ICD-10-CM

## 2017-09-20 DIAGNOSIS — Z87891 Personal history of nicotine dependence: Secondary | ICD-10-CM | POA: Insufficient documentation

## 2017-09-20 DIAGNOSIS — R509 Fever, unspecified: Secondary | ICD-10-CM

## 2017-09-20 DIAGNOSIS — K219 Gastro-esophageal reflux disease without esophagitis: Secondary | ICD-10-CM | POA: Insufficient documentation

## 2017-09-20 DIAGNOSIS — R112 Nausea with vomiting, unspecified: Secondary | ICD-10-CM

## 2017-09-20 DIAGNOSIS — R778 Other specified abnormalities of plasma proteins: Secondary | ICD-10-CM | POA: Diagnosis present

## 2017-09-20 DIAGNOSIS — Z79899 Other long term (current) drug therapy: Secondary | ICD-10-CM | POA: Insufficient documentation

## 2017-09-20 DIAGNOSIS — I10 Essential (primary) hypertension: Secondary | ICD-10-CM | POA: Insufficient documentation

## 2017-09-20 DIAGNOSIS — J209 Acute bronchitis, unspecified: Secondary | ICD-10-CM | POA: Insufficient documentation

## 2017-09-20 DIAGNOSIS — R945 Abnormal results of liver function studies: Secondary | ICD-10-CM | POA: Diagnosis present

## 2017-09-20 DIAGNOSIS — E876 Hypokalemia: Secondary | ICD-10-CM | POA: Diagnosis present

## 2017-09-20 DIAGNOSIS — Z888 Allergy status to other drugs, medicaments and biological substances status: Secondary | ICD-10-CM | POA: Insufficient documentation

## 2017-09-20 HISTORY — DX: Gastro-esophageal reflux disease without esophagitis: K21.9

## 2017-09-20 LAB — COMPREHENSIVE METABOLIC PANEL
ALBUMIN: 3 g/dL — AB (ref 3.5–5.0)
ALT: 47 U/L — AB (ref 0–44)
AST: 60 U/L — AB (ref 15–41)
Alkaline Phosphatase: 161 U/L — ABNORMAL HIGH (ref 38–126)
Anion gap: 9 (ref 5–15)
BILIRUBIN TOTAL: 0.8 mg/dL (ref 0.3–1.2)
BUN: 13 mg/dL (ref 6–20)
CHLORIDE: 94 mmol/L — AB (ref 98–111)
CO2: 26 mmol/L (ref 22–32)
CREATININE: 0.95 mg/dL (ref 0.61–1.24)
Calcium: 8 mg/dL — ABNORMAL LOW (ref 8.9–10.3)
GFR calc Af Amer: >60 mL/min (ref >60)
GFR calc non Af Amer: >60 mL/min (ref >60)
GLUCOSE: 107 mg/dL — AB (ref 70–99)
POTASSIUM: 3 mmol/L — AB (ref 3.5–5.1)
Sodium: 129 mmol/L — ABNORMAL LOW (ref 135–145)
Total Protein: 7.1 g/dL (ref 6.5–8.1)

## 2017-09-20 LAB — URINALYSIS, ROUTINE W REFLEX MICROSCOPIC
BACTERIA UA: NONE SEEN
BILIRUBIN URINE: NEGATIVE
GLUCOSE, UA: NEGATIVE mg/dL
KETONES UR: 5 mg/dL — AB
LEUKOCYTES UA: NEGATIVE
NITRITE: NEGATIVE
PH: 6 (ref 5.0–8.0)
PROTEIN: 30 mg/dL — AB
Specific Gravity, Urine: 1.012 (ref 1.005–1.030)

## 2017-09-20 LAB — CBC WITH DIFFERENTIAL/PLATELET
Basophils Absolute: 0 10*3/uL (ref 0.0–0.1)
Basophils Relative: 0 %
Eosinophils Absolute: 0 10*3/uL (ref 0.0–0.7)
Eosinophils Relative: 0 %
HEMATOCRIT: 35.4 % — AB (ref 39.0–52.0)
HEMOGLOBIN: 12.6 g/dL — AB (ref 13.0–17.0)
LYMPHS ABS: 0.6 10*3/uL — AB (ref 0.7–4.0)
LYMPHS PCT: 8 %
MCH: 29.7 pg (ref 26.0–34.0)
MCHC: 35.6 g/dL (ref 30.0–36.0)
MCV: 83.5 fL (ref 78.0–100.0)
MONOS PCT: 11 %
Monocytes Absolute: 0.8 10*3/uL (ref 0.1–1.0)
NEUTROS ABS: 5.7 10*3/uL (ref 1.7–7.7)
NEUTROS PCT: 81 %
Platelets: 216 10*3/uL (ref 150–400)
RBC: 4.24 MIL/uL (ref 4.22–5.81)
RDW: 12.5 % (ref 11.5–15.5)
WBC: 7.1 10*3/uL (ref 4.0–10.5)

## 2017-09-20 LAB — PROTIME-INR
INR: 1.01
PROTHROMBIN TIME: 13.2 s (ref 11.4–15.2)

## 2017-09-20 LAB — LACTIC ACID, PLASMA
Lactic Acid, Venous: 1 mmol/L (ref 0.5–1.9)
Lactic Acid, Venous: 0.9 mmol/L (ref 0.5–1.9)

## 2017-09-20 LAB — TROPONIN I
TROPONIN I: 0.04 ng/mL — AB (ref <0.03)
Troponin I: 0.03 ng/mL (ref <0.03)

## 2017-09-20 LAB — BRAIN NATRIURETIC PEPTIDE: B NATRIURETIC PEPTIDE 5: 45 pg/mL (ref 0.0–100.0)

## 2017-09-20 LAB — LIPASE, BLOOD: Lipase: 49 U/L (ref 11–51)

## 2017-09-20 LAB — MAGNESIUM: MAGNESIUM: 2.2 mg/dL (ref 1.7–2.4)

## 2017-09-20 MED ORDER — POTASSIUM CHLORIDE CRYS ER 20 MEQ PO TBCR
40.0000 meq | EXTENDED_RELEASE_TABLET | Freq: Once | ORAL | Status: AC
  Administered 2017-09-20: 40 meq via ORAL
  Filled 2017-09-20: qty 2

## 2017-09-20 MED ORDER — SODIUM CHLORIDE 0.9 % IV SOLN
INTRAVENOUS | Status: DC
  Administered 2017-09-20: 22:00:00 via INTRAVENOUS

## 2017-09-20 MED ORDER — ACETAMINOPHEN 325 MG PO TABS
650.0000 mg | ORAL_TABLET | Freq: Once | ORAL | Status: AC
  Administered 2017-09-20: 650 mg via ORAL
  Filled 2017-09-20: qty 2

## 2017-09-20 MED ORDER — IPRATROPIUM-ALBUTEROL 0.5-2.5 (3) MG/3ML IN SOLN
3.0000 mL | Freq: Once | RESPIRATORY_TRACT | Status: AC
  Administered 2017-09-20: 3 mL via RESPIRATORY_TRACT
  Filled 2017-09-20: qty 3

## 2017-09-20 MED ORDER — SODIUM CHLORIDE 0.9 % IV BOLUS
500.0000 mL | Freq: Once | INTRAVENOUS | Status: AC
  Administered 2017-09-20: 500 mL via INTRAVENOUS

## 2017-09-20 NOTE — ED Notes (Signed)
Date and time results received: 09/20/17 9:17 PM  Test: Troponin Critical Value: 0.03  Name of Provider Notified: Clarene DukeMcManus notified by Vidal SchwalbeM Doss RN  Orders Received? Or Actions Taken?: no new orders at this time

## 2017-09-20 NOTE — ED Triage Notes (Signed)
Pt in by [facility] for cough, congestion x 1 week, tonight with fever 103 per ems.

## 2017-09-20 NOTE — ED Notes (Signed)
Respiratory paged at this time for tx.  

## 2017-09-20 NOTE — ED Provider Notes (Signed)
[facility] EMERGENCY DEPARTMENT Provider Note   CSN: [id number] Arrival date & time: 09/20/17  4098     History   Chief Complaint Chief Complaint  Patient presents with  . Fever    HPI Ryan Finley is a 57 y.o. male.  HPI  Pt was seen at 1950.  Per pt, c/o gradual onset and persistence of constant multiple symptoms for the past 4 days. Symptoms include: cough, wheezing, multiple intermittent episodes of N/V/D, and "sweating." Denies objective fevers, no CP/palpitations, no SOB, no abd pain, no back pain, no neck pain, no rash, no focal motor weakness, no tingling/numbness in extremities.   Past Medical History:  Diagnosis Date  . Gastroesophageal reflux   . Hypertension     Patient Active Problem List   Diagnosis Date Noted  . Closed right tibial fracture 06/07/2015    Past Surgical History:  Procedure Laterality Date  . TIBIA IM NAIL INSERTION Right 06/06/2015   Procedure: INTRAMEDULLARY (IM) NAIL TIBIAL;  Surgeon: Samson Frederic, MD;  Location: [facility] OR;  Service: Orthopedics;  Laterality: Right;        Home Medications    Prior to Admission medications   Medication Sig Start Date End Date Taking? Authorizing Provider  aspirin EC 325 MG tablet Take 1 tablet (325 mg total) by mouth 2 (two) times daily after a meal. 06/09/15   Swinteck, Arlys Idrees, MD  docusate sodium (COLACE) 100 MG capsule Take 1 capsule (100 mg total) by mouth 2 (two) times daily. 06/09/15   Swinteck, Arlys Norvel, MD  lisinopril (PRINIVIL,ZESTRIL) 30 MG tablet Take 30 mg by mouth daily.    [provider]  omeprazole (PRILOSEC) 20 MG capsule Take 20 mg by mouth daily.    [provider]  ondansetron (ZOFRAN) 4 MG tablet Take 1 tablet (4 mg total) by mouth every 6 (six) hours as needed for nausea. 06/09/15   Swinteck, Arlys Tremar, MD  oxyCODONE (ROXICODONE) 5 MG immediate release tablet Take 2-3 tablets (10-15 mg total) by mouth every 4 (four) hours as needed for severe pain. 06/09/15   Swinteck,  Arlys Reynald, MD  senna (SENOKOT) 8.6 MG TABS tablet Take 2 tablets (17.2 mg total) by mouth at bedtime. Patient not taking: Reported on 06/29/2015 06/09/15   Samson Frederic, MD  terazosin (HYTRIN) 5 MG capsule Take 5 mg by mouth at bedtime. Reported on 06/29/2015    [provider]    Family History No family history on file.  Social History Social History   Tobacco Use  . Smoking status: Former Games developer  . Smokeless tobacco: Never Used  Substance Use Topics  . Alcohol use: No  . Drug use: Not on file     Allergies   Celexa [citalopram]   Review of Systems Review of Systems ROS: Statement: All systems negative except as marked or noted in the HPI; Constitutional: Negative for objective fever and +sweating/chills. ; ; Eyes: Negative for eye pain, redness and discharge. ; ; ENMT: Negative for ear pain, hoarseness, nasal congestion, sinus pressure and sore throat. ; ; Cardiovascular: Negative for chest pain, palpitations, diaphoresis, dyspnea and peripheral edema. ; ; Respiratory: +cough, wheezing. Negative for stridor. ; ; Gastrointestinal: +N/V/D. Negative for abdominal pain, blood in stool, hematemesis, jaundice and rectal bleeding. . ; ; Genitourinary: Negative for dysuria, flank pain and hematuria. ; ; Musculoskeletal: Negative for back pain and neck pain. Negative for swelling and trauma.; ; Skin: Negative for pruritus, rash, abrasions, blisters, bruising and skin lesion.; ; Neuro: Negative for headache,  lightheadedness and neck stiffness. Negative for weakness, altered level of consciousness, altered mental status, extremity weakness, paresthesias, involuntary movement, seizure and syncope.       Physical Exam Updated Vital Signs BP (!) 148/98 (BP Location: Left Arm)   Pulse (!) 102   Temp 98.4 F (36.9 C) (Oral)   Resp (!) 22   Ht 5\' 7"  (1.702 m)   Wt 87.1 kg   SpO2 91%   BMI 30.07 kg/m    Patient Vitals for the past 24 hrs:  BP Temp Temp src Pulse Resp SpO2 Height  Weight  09/20/17 2100 133/72 - - (!) 103 (!) 23 95 % - -  09/20/17 2048 - - - - - 94 % - -  09/20/17 2039 - (!) 102.4 F (39.1 C) Rectal - - - - -  09/20/17 2030 (!) 142/90 - - 95 (!) 25 94 % - -  09/20/17 1923 (!) 148/98 98.4 F (36.9 C) Oral (!) 102 (!) 22 91 % - -  09/20/17 1920 - - - - - - 5\' 7"  (1.702 m) 87.1 kg   21:36:57 Orthostatic Vital Signs SL  Orthostatic Lying   BP- Lying: 121/76   Pulse- Lying: 115       Orthostatic Sitting  BP- Sitting: 111/77   Pulse- Sitting: 113       Orthostatic Standing at 0 minutes  BP- Standing at 0 minutes: 113/78   Pulse- Standing at 0 minutes: 115      Physical Exam 1955: Physical examination:  Nursing notes reviewed; Vital signs and O2 SAT reviewed;  Constitutional: Well developed, Well nourished, Well hydrated, In no acute distress; Head:  Normocephalic, atraumatic; Eyes: EOMI, PERRL, No scleral icterus; ENMT: Mouth and pharynx normal, Mucous membranes moist; Neck: Supple, Full range of motion, No lymphadenopathy. No meningeal signs.; Cardiovascular: Tachycardic rate and rhythm, No gallop; Respiratory: Breath sounds coarse & equal bilaterally, No wheezes.  Speaking full sentences with ease, Normal respiratory effort/excursion; Chest: Nontender, Movement normal; Abdomen: Soft, Nontender, Nondistended, Normal bowel sounds; Genitourinary: No CVA tenderness; Extremities: Peripheral pulses normal, No tenderness, +1 pedal edema bilat. No calf edema or asymmetry.; Neuro: AA&Ox3, Major CN grossly intact.  Speech clear. No gross focal motor or sensory deficits in extremities.; Skin: Color normal, Warm, Dry.   ED Treatments / Results  Labs (all labs ordered are listed, but only abnormal results are displayed)   EKG EKG Interpretation  Date/Time:  Wednesday September 20 2017 20:16:58 EDT Ventricular Rate:  96 PR Interval:    QRS Duration: 99 QT Interval:  389 QTC Calculation: 492 R Axis:   -8 Text Interpretation:  Sinus rhythm Abnormal  R-wave progression, early transition Borderline T abnormalities, anterior leads Borderline prolonged QT interval Artifact No old tracing to compare Confirmed by Samuel Jester [phone]) on 09/20/2017 8:36:41 PM   Radiology   Procedures Procedures (including critical care time)  Medications Ordered in ED Medications - No data to display   Initial Impression / Assessment and Plan / ED Course  I have reviewed the triage vital signs and the nursing notes.  Pertinent labs & imaging results that were available during my care of the patient were reviewed by me and considered in my medical decision making (see chart for details).  MDM Reviewed: previous chart, nursing note and vitals Reviewed previous: labs and ECG Interpretation: labs, ECG and x-ray Total time providing critical care: 30-74 minutes. This excludes time spent performing separately reportable procedures and services. Consults: admitting MD   CRITICAL CARE  Performed by: Samuel Jester Total critical care time: 35 minutes Critical care time was exclusive of separately billable procedures and treating other patients. Critical care was necessary to treat or prevent imminent or life-threatening deterioration. Critical care was time spent personally by me on the following activities: development of treatment plan with patient and/or surrogate as well as nursing, discussions with consultants, evaluation of patient's response to treatment, examination of patient, obtaining history from patient or surrogate, ordering and performing treatments and interventions, ordering and review of laboratory studies, ordering and review of radiographic studies, pulse oximetry and re-evaluation of patient's condition.   Results for orders placed or performed during the hospital encounter of 09/20/17  Culture, blood (routine x 2)  Result Value Ref Range   Specimen Description BLOOD RIGHT HAND    Special Requests      BOTTLES DRAWN AEROBIC AND  ANAEROBIC Blood Culture adequate volume Performed at [facility], [address], [place], [place] [address]    Culture PENDING    Report Status PENDING   Comprehensive metabolic panel  Result Value Ref Range   Sodium 129 (L) 135 - 145 mmol/L   Potassium 3.0 (L) 3.5 - 5.1 mmol/L   Chloride 94 (L) 98 - 111 mmol/L   CO2 26 22 - 32 mmol/L   Glucose, Bld 107 (H) 70 - 99 mg/dL   BUN 13 6 - 20 mg/dL   Creatinine, Ser 1.91 0.61 - 1.24 mg/dL   Calcium 8.0 (L) 8.9 - 10.3 mg/dL   Total Protein 7.1 6.5 - 8.1 g/dL   Albumin 3.0 (L) 3.5 - 5.0 g/dL   AST 60 (H) 15 - 41 U/L   ALT 47 (H) 0 - 44 U/L   Alkaline Phosphatase 161 (H) 38 - 126 U/L   Total Bilirubin 0.8 0.3 - 1.2 mg/dL   GFR calc non Af Amer >60 >60 mL/min   GFR calc Af Amer >60 >60 mL/min   Anion gap 9 5 - 15  Lipase, blood  Result Value Ref Range   Lipase 49 11 - 51 U/L  Troponin I  Result Value Ref Range   Troponin I 0.03 (HH) <0.03 ng/mL  Lactic acid, plasma  Result Value Ref Range   Lactic Acid, Venous 1.0 0.5 - 1.9 mmol/L  Lactic acid, plasma  Result Value Ref Range   Lactic Acid, Venous 0.9 0.5 - 1.9 mmol/L  CBC with Differential  Result Value Ref Range   WBC 7.1 4.0 - 10.5 K/uL   RBC 4.24 4.22 - 5.81 MIL/uL   Hemoglobin 12.6 (L) 13.0 - 17.0 g/dL   HCT 47.8 (L) 29.5 - 62.1 %   MCV 83.5 78.0 - 100.0 fL   MCH 29.7 26.0 - 34.0 pg   MCHC 35.6 30.0 - 36.0 g/dL   RDW 30.8 65.7 - 84.6 %   Platelets 216 150 - 400 K/uL   Neutrophils Relative % 81 %   Neutro Abs 5.7 1.7 - 7.7 K/uL   Lymphocytes Relative 8 %   Lymphs Abs 0.6 (L) 0.7 - 4.0 K/uL   Monocytes Relative 11 %   Monocytes Absolute 0.8 0.1 - 1.0 K/uL   Eosinophils Relative 0 %   Eosinophils Absolute 0.0 0.0 - 0.7 K/uL   Basophils Relative 0 %   Basophils Absolute 0.0 0.0 - 0.1 K/uL  Protime-INR  Result Value Ref Range   Prothrombin Time 13.2 11.4 - 15.2 seconds   INR 1.01   Urinalysis, Routine w reflex microscopic  Result Value Ref  Range   Color, Urine  YELLOW YELLOW   APPearance CLEAR CLEAR   Specific Gravity, Urine 1.012 1.005 - 1.030   pH 6.0 5.0 - 8.0   Glucose, UA NEGATIVE NEGATIVE mg/dL   Hgb urine dipstick MODERATE (A) NEGATIVE   Bilirubin Urine NEGATIVE NEGATIVE   Ketones, ur 5 (A) NEGATIVE mg/dL   Protein, ur 30 (A) NEGATIVE mg/dL   Nitrite NEGATIVE NEGATIVE   Leukocytes, UA NEGATIVE NEGATIVE   RBC / HPF 0-5 0 - 5 RBC/hpf   WBC, UA 0-5 0 - 5 WBC/hpf   Bacteria, UA NONE SEEN NONE SEEN   Squamous Epithelial / LPF 0-5 0 - 5  Magnesium  Result Value Ref Range   Magnesium 2.2 1.7 - 2.4 mg/dL   Dg Chest 2 View Result Date: 09/20/2017 CLINICAL DATA:  Cough EXAM: CHEST - 2 VIEW COMPARISON:  None. FINDINGS: Mild interstitial prominence and peribronchial thickening. Heart and mediastinal contours are within normal limits. No focal opacities or effusions. No acute bony abnormality. IMPRESSION: Peribronchial thickening and interstitial prominence, likely bronchitic changes. Electronically Signed   By: Charlett NoseKevin  Dover M.D.   On: 09/20/2017 20:18     2235:  APAP given for fever. Judicious IVF given for new hyponatremia on labs and increasing tachycardia on orthostatic VS. CXR without acute infiltrate or CHF. Potassium repleted PO. T/C returned from [facility]. Robb Matarrtiz, case discussed, including:  HPI, pertinent PM/SHx, VS/PE, dx testing, ED course and treatment:  Agreeable to admit.       Final Clinical Impressions(s) / ED Diagnoses   Final diagnoses:  None    ED Discharge Orders    None       Samuel JesterMcManus, Rylin Saez, DO 09/21/17 2258

## 2017-09-20 NOTE — H&P (Signed)
History and Physical    Ryan Finley:[id number] DOB: 10/07/1960 DOA: 09/20/2017  PCP: Patient, No Pcp Per   Patient coming from: Home.  I have personally briefly reviewed patient's old medical records in [facility]  Chief Complaint: Cough, congestion and fever.  HPI: Ryan Finley is a 57 y.o. male with medical history significant of GERD and hypertension who is coming to the emergency department with complaints of cough, congestion, fever, cough induced nausea and emesis for several days.  The patient also had several episodes of diarrhea on Sunday.  He denies sick contacts or travel history.  He has been having night sweats and low-grade fevers.  However, denies rhinorrhea, hemoptysis, palpitations, dizziness, diaphoresis, PND, orthopnea or pitting edema of the lower extremities.  He denies constipation, melena or hematochezia.  He denies dysuria, frequency or hematuria.  However states that his urine has been looking very dark and concentrated for the past few days.  He denies polyuria, polydipsia, polyphagia or blurred vision.  Denies skin rashes or pruritus.  ED Course: Initial vital signs temperature 98.4 F, pulse 102, respirations 22, blood pressure 148/98 mmHg and O2 sat 91% on room air.  Work-up shows a urinalysis with moderate hemoglobinuria, ketones of 5 and proteinuria 30 mg/dL.  His CBC showed a white count was 7.1 with 81% neutrophils, 8% lymphocytes and 11% monocytes.  Hemoglobin was 12.6 g/dL and platelets 811.  PT 13.2 seconds and INR 1.01.    Lactic acid #1 was 1.0 calcium number two 0.9, sodium 129, potassium 3.0, chloride 94 and CO2 26 mmol/L.  BUN 13, creatinine 0.95, glucose 107, magnesium 2.2 and calcium 8.0 mg/dL.  Calcium is normal when corrected to albumin.  Total protein was 7.1, albumin 3.0 g/dL.  AST was 60, ALT was 47 and alkaline phosphatase 161 units/L.  Total bilirubin was 0.8 mg/dL.    Troponin #1 was 0.03 and troponin #2 0.04 ng/mL.  BNP was 45.0  pg/mL.  EKG was sinus rhythm with a normal R wave progression, borderline T abnormalities and borderline prolonged QT interval.  However there was some artifact.  Imaging: His chest radiograph showed peribronchial thickening and interstitial prominence, likely bronchitic changes.  Please see images and full radiology report for further detail.  Review of Systems: As per HPI otherwise 10 point review of systems negative.   Past Medical History:  Diagnosis Date  . Gastroesophageal reflux   . Hypertension     Past Surgical History:  Procedure Laterality Date  . TIBIA IM NAIL INSERTION Right 06/06/2015   Procedure: INTRAMEDULLARY (IM) NAIL TIBIAL;  Surgeon: Samson Frederic, MD;  Location: [facility] OR;  Service: Orthopedics;  Laterality: Right;     reports that he has quit smoking. He has never used smokeless tobacco. He reports that he does not drink alcohol. His drug history is not on file.  Allergies  Allergen Reactions  . Celexa [Citalopram] Other (See Comments)    Mental processing issues   Family medical history. Mother had hypertension. Father had alcoholism. Sister has a history of ulcerative colitis.  Prior to Admission medications   Medication Sig Start Date End Date Taking? Authorizing Provider  lisinopril (PRINIVIL,ZESTRIL) 20 MG tablet Take 20 mg by mouth daily.    Yes [provider]  omeprazole (PRILOSEC) 20 MG capsule Take 20 mg by mouth daily.   Yes [provider]    Physical Exam: Vitals:   09/20/17 2200 09/20/17 2230 09/20/17 2246 09/20/17 2321  BP: 127/72 127/74  99/60  Pulse: (!) 101 (!) 101  91  Resp: (!) 21 16  14   Temp:   100.2 F (37.9 C)   TempSrc:      SpO2: 90% 93%  90%  Weight:      Height:        Constitutional: Looks acutely ill. Eyes: PERRL, lids and conjunctivae normal ENMT: Mucous membranes are moist. Posterior pharynx clear of any exudate or lesions. Neck: normal, supple, no masses, no thyromegaly Respiratory: Decreased  breath sounds bilaterally with rhonchi and wheezing, no crackles.  No accessory muscle use.  Cardiovascular: Regular rate and rhythm, no murmurs / rubs / gallops. No extremity edema. 2+ pedal pulses. No carotid bruits.  Abdomen: Soft, mild epigastric tenderness, no masses palpated. No hepatosplenomegaly. Bowel sounds positive.  Musculoskeletal: no clubbing / cyanosis. No joint deformity upper and lower extremities. Good ROM, no contractures. Normal muscle tone.  Skin: no rashes, lesions, ulcers  Neurologic: CN 2-12 grossly intact. Sensation intact, DTR normal. Strength 5/5 in all 4.  Psychiatric: Normal judgment and insight. Alert and oriented x 4. Normal mood.    Labs on Admission: I have personally reviewed following labs and imaging studies  CBC: Recent Labs  Lab 09/20/17 1927  WBC 7.1  NEUTROABS 5.7  HGB 12.6*  HCT 35.4*  MCV 83.5  PLT 216   Basic Metabolic Panel: Recent Labs  Lab 09/20/17 1927  NA 129*  K 3.0*  CL 94*  CO2 26  GLUCOSE 107*  BUN 13  CREATININE 0.95  CALCIUM 8.0*  MG 2.2   GFR: Estimated Creatinine Clearance: 93.7 mL/min (by C-G formula based on SCr of 0.95 mg/dL). Liver Function Tests: Recent Labs  Lab 09/20/17 1927  AST 60*  ALT 47*  ALKPHOS 161*  BILITOT 0.8  PROT 7.1  ALBUMIN 3.0*   Recent Labs  Lab 09/20/17 1927  LIPASE 49   No results for input(s): AMMONIA in the last 168 hours. Coagulation Profile: Recent Labs  Lab 09/20/17 1927  INR 1.01   Cardiac Enzymes: Recent Labs  Lab 09/20/17 1927 09/20/17 2132  TROPONINI 0.03* 0.04*   BNP (last 3 results) No results for input(s): PROBNP in the last 8760 hours. HbA1C: No results for input(s): HGBA1C in the last 72 hours. CBG: No results for input(s): GLUCAP in the last 168 hours. Lipid Profile: No results for input(s): CHOL, HDL, LDLCALC, TRIG, CHOLHDL, LDLDIRECT in the last 72 hours. Thyroid Function Tests: No results for input(s): TSH, T4TOTAL, FREET4, T3FREE, THYROIDAB  in the last 72 hours. Anemia Panel: No results for input(s): VITAMINB12, FOLATE, FERRITIN, TIBC, IRON, RETICCTPCT in the last 72 hours. Urine analysis:    Component Value Date/Time   COLORURINE YELLOW 09/20/2017 2001   APPEARANCEUR CLEAR 09/20/2017 2001   LABSPEC 1.012 09/20/2017 2001   PHURINE 6.0 09/20/2017 2001   GLUCOSEU NEGATIVE 09/20/2017 2001   HGBUR MODERATE (A) 09/20/2017 2001   BILIRUBINUR NEGATIVE 09/20/2017 2001   KETONESUR 5 (A) 09/20/2017 2001   PROTEINUR 30 (A) 09/20/2017 2001   NITRITE NEGATIVE 09/20/2017 2001   LEUKOCYTESUR NEGATIVE 09/20/2017 2001    Radiological Exams on Admission: Dg Chest 2 View  Result Date: 09/20/2017 CLINICAL DATA:  Cough EXAM: CHEST - 2 VIEW COMPARISON:  None. FINDINGS: Mild interstitial prominence and peribronchial thickening. Heart and mediastinal contours are within normal limits. No focal opacities or effusions. No acute bony abnormality. IMPRESSION: Peribronchial thickening and interstitial prominence, likely bronchitic changes. Electronically Signed   By: Charlett NoseKevin  Dover M.D.   On: 09/20/2017 20:18  EKG: Independently reviewed.  Vent. rate 96 BPM PR interval * ms QRS duration 99 ms QT/QTc 389/492 ms P-R-T axes 71 -8 25 Sinus rhythm Abnormal R-wave progression, early transition Borderline T abnormalities, anterior leads Borderline prolonged QT interval Artifact  Assessment/Plan Principal Problem:   Acute gastroenteritis   Acute bronchitis Case initially presented as gastroenteritis, however bronchitis seems to be the main issue.  The patient smoked 1 PPD for more than 30 years. Continue IV fluids. Continue supplemental oxygen. Start as scheduled and as needed bronchodilators. Solu-Medrol 125 mg IVP x1 dose. Toradol 30 mg IVP x1 dose. Levaquin 750 mg IVPB every 24 hours.  Active Problems:   Hypokalemia Replacing. Follow-up potassium level.    Hyponatremia Replacing. Follow-up sodium level.    Elevated  troponin Trend troponin level.    Abnormal liver function tests The patient patient ceased EtOH use 12 years ago. He uses kratom for pain. Monitor LFTs.    Anemia Check anemia panel.    Hypertension Has not been on medication for a while. Monitor blood pressure.   DVT prophylaxis: SCDs. Code Status: Full code. Family Communication:  Disposition Plan: Observation for IV hydration and IV antibiotics. Consults called:  Admission status: Observation/telemetry   Bobette Mo MD [facility] Pager [phone].  If 7PM-7AM, please contact night-coverage [email or web address] Password [email or web address]  09/20/2017, 11:40 PM

## 2017-09-21 ENCOUNTER — Other Ambulatory Visit: Payer: Self-pay

## 2017-09-21 DIAGNOSIS — J209 Acute bronchitis, unspecified: Secondary | ICD-10-CM | POA: Diagnosis present

## 2017-09-21 LAB — CBC WITH DIFFERENTIAL/PLATELET
BASOS PCT: 0 %
Basophils Absolute: 0 10*3/uL (ref 0.0–0.1)
EOS ABS: 0 10*3/uL (ref 0.0–0.7)
EOS PCT: 0 %
HEMATOCRIT: 33.5 % — AB (ref 39.0–52.0)
Hemoglobin: 11.5 g/dL — ABNORMAL LOW (ref 13.0–17.0)
Lymphocytes Relative: 10 %
Lymphs Abs: 0.6 10*3/uL — ABNORMAL LOW (ref 0.7–4.0)
MCH: 29.1 pg (ref 26.0–34.0)
MCHC: 34.3 g/dL (ref 30.0–36.0)
MCV: 84.8 fL (ref 78.0–100.0)
MONO ABS: 0.6 10*3/uL (ref 0.1–1.0)
MONOS PCT: 10 %
Neutro Abs: 4.6 10*3/uL (ref 1.7–7.7)
Neutrophils Relative %: 80 %
PLATELETS: 187 10*3/uL (ref 150–400)
RBC: 3.95 MIL/uL — ABNORMAL LOW (ref 4.22–5.81)
RDW: 12.7 % (ref 11.5–15.5)
WBC: 5.8 10*3/uL (ref 4.0–10.5)

## 2017-09-21 LAB — COMPREHENSIVE METABOLIC PANEL
ALT: 37 U/L (ref 0–44)
ANION GAP: 10 (ref 5–15)
AST: 47 U/L — ABNORMAL HIGH (ref 15–41)
Albumin: 2.7 g/dL — ABNORMAL LOW (ref 3.5–5.0)
Alkaline Phosphatase: 136 U/L — ABNORMAL HIGH (ref 38–126)
BILIRUBIN TOTAL: 0.5 mg/dL (ref 0.3–1.2)
BUN: 11 mg/dL (ref 6–20)
CO2: 25 mmol/L (ref 22–32)
Calcium: 8 mg/dL — ABNORMAL LOW (ref 8.9–10.3)
Chloride: 101 mmol/L (ref 98–111)
Creatinine, Ser: 0.81 mg/dL (ref 0.61–1.24)
GFR calc Af Amer: >60 mL/min (ref >60)
GFR calc non Af Amer: >60 mL/min (ref >60)
GLUCOSE: 93 mg/dL (ref 70–99)
POTASSIUM: 3.5 mmol/L (ref 3.5–5.1)
SODIUM: 136 mmol/L (ref 135–145)
TOTAL PROTEIN: 6.3 g/dL — AB (ref 6.5–8.1)

## 2017-09-21 LAB — EXPECTORATED SPUTUM ASSESSMENT W REFEX TO RESP CULTURE

## 2017-09-21 LAB — MRSA PCR SCREENING: MRSA by PCR: NEGATIVE

## 2017-09-21 LAB — EXPECTORATED SPUTUM ASSESSMENT W GRAM STAIN, RFLX TO RESP C

## 2017-09-21 MED ORDER — POTASSIUM CHLORIDE IN NACL 20-0.9 MEQ/L-% IV SOLN
INTRAVENOUS | Status: AC
  Administered 2017-09-21: 04:00:00 via INTRAVENOUS

## 2017-09-21 MED ORDER — METHYLPREDNISOLONE SODIUM SUCC 125 MG IJ SOLR
125.0000 mg | Freq: Once | INTRAMUSCULAR | Status: AC
  Administered 2017-09-21: 125 mg via INTRAVENOUS
  Filled 2017-09-21: qty 2

## 2017-09-21 MED ORDER — PROMETHAZINE HCL 25 MG/ML IJ SOLN
12.5000 mg | INTRAMUSCULAR | Status: DC | PRN
  Administered 2017-09-21: 12.5 mg via INTRAVENOUS
  Filled 2017-09-21: qty 1

## 2017-09-21 MED ORDER — ACETAMINOPHEN 325 MG PO TABS: 650.0000 mg | ORAL_TABLET | Freq: Four times a day (QID) | ORAL | Status: DC | PRN

## 2017-09-21 MED ORDER — ALBUTEROL SULFATE (2.5 MG/3ML) 0.083% IN NEBU: 2.5000 mg | INHALATION_SOLUTION | RESPIRATORY_TRACT | Status: DC | PRN

## 2017-09-21 MED ORDER — IPRATROPIUM-ALBUTEROL 0.5-2.5 (3) MG/3ML IN SOLN
3.0000 mL | Freq: Four times a day (QID) | RESPIRATORY_TRACT | Status: DC
  Administered 2017-09-21 – 2017-09-22 (×5): 3 mL via RESPIRATORY_TRACT
  Filled 2017-09-21 (×5): qty 3

## 2017-09-21 MED ORDER — FAMOTIDINE IN NACL 20-0.9 MG/50ML-% IV SOLN
20.0000 mg | Freq: Once | INTRAVENOUS | Status: AC
  Administered 2017-09-21: 20 mg via INTRAVENOUS
  Filled 2017-09-21: qty 50

## 2017-09-21 MED ORDER — METHYLPREDNISOLONE SODIUM SUCC 40 MG IJ SOLR
40.0000 mg | Freq: Four times a day (QID) | INTRAMUSCULAR | Status: AC
  Administered 2017-09-21 – 2017-09-22 (×4): 40 mg via INTRAVENOUS
  Filled 2017-09-21 (×4): qty 1

## 2017-09-21 MED ORDER — ACETAMINOPHEN 650 MG RE SUPP: 650.0000 mg | Freq: Four times a day (QID) | RECTAL | Status: DC | PRN

## 2017-09-21 MED ORDER — MORPHINE SULFATE (PF) 4 MG/ML IV SOLN: 4.0000 mg | INTRAVENOUS | Status: DC | PRN

## 2017-09-21 MED ORDER — ONDANSETRON HCL 4 MG PO TABS: 4.0000 mg | ORAL_TABLET | Freq: Four times a day (QID) | ORAL | Status: DC | PRN

## 2017-09-21 MED ORDER — LEVOFLOXACIN IN D5W 750 MG/150ML IV SOLN
750.0000 mg | INTRAVENOUS | Status: DC
  Administered 2017-09-21 – 2017-09-22 (×2): 750 mg via INTRAVENOUS
  Filled 2017-09-21 (×2): qty 150

## 2017-09-21 MED ORDER — METRONIDAZOLE IN NACL 5-0.79 MG/ML-% IV SOLN
500.0000 mg | Freq: Three times a day (TID) | INTRAVENOUS | Status: DC
  Filled 2017-09-21: qty 100

## 2017-09-21 MED ORDER — GUAIFENESIN 100 MG/5ML PO SOLN
5.0000 mL | ORAL | Status: DC | PRN
  Administered 2017-09-21 (×3): 100 mg via ORAL
  Filled 2017-09-21 (×3): qty 5

## 2017-09-21 MED ORDER — KETOROLAC TROMETHAMINE 30 MG/ML IJ SOLN
30.0000 mg | Freq: Once | INTRAMUSCULAR | Status: AC
  Administered 2017-09-21: 30 mg via INTRAVENOUS
  Filled 2017-09-21: qty 1

## 2017-09-21 MED ORDER — ONDANSETRON HCL 4 MG/2ML IJ SOLN: 4.0000 mg | Freq: Four times a day (QID) | INTRAMUSCULAR | Status: DC | PRN

## 2017-09-21 NOTE — Care Management Note (Addendum)
Case Management Note  Patient Details  Name: Clide CliffJohn M Brunn MRN: [id number] Date of Birth: October 19, 1960  Subjective/Objective:  Bronchitis. From home. No PCP, no insurance. Currently on room air, was requiring 2 liters.  Works Full time.                   Action/Plan: CM will follow for needs. May need MATCH depending on DC prescriptions.  Expected Discharge Date:   09/22/2017               Expected Discharge Plan:     In-House Referral:     Discharge planning Services  CM Consult  Post Acute Care Choice:    Choice offered to:     DME Arranged:    DME Agency:     HH Arranged:    HH Agency:     Status of Service:  In process, will continue to follow  If discussed at Long Length of Stay Meetings, dates discussed:    Additional Comments:  Travian Kerner, Chrystine OilerSharley Diane, RN 09/21/2017, 11:44 AM

## 2017-09-21 NOTE — Progress Notes (Signed)
PROGRESS NOTE    Ryan Finley  ZOX:[id number] DOB: 10-May-1960 DOA: 09/20/2017 PCP: Patient, No Pcp Per   Brief Narrative:   Ryan CliffJohn M Parkerson is a 57 y.o. male with medical history significant of GERD and hypertension who is coming to the emergency department with complaints of cough, congestion, fever, cough induced nausea and emesis for several days.  The patient also had several episodes of diarrhea on Sunday.  He denies sick contacts or travel history.  He has been having night sweats and low-grade fevers.  He has been admitted with acute bronchitis and possible gastroenteritis.  He has been started on IV fluids as well as bronchodilators, Solu-Medrol, and Levaquin.  Assessment & Plan:   Principal Problem:   Acute gastroenteritis Active Problems:   Hypertension   Hypokalemia   Hyponatremia   Elevated troponin   Abnormal liver function tests   Anemia   Acute bronchitis  1. Acute bronchitis.  Continue bronchodilators, supplemental oxygen, Solu-Medrol, and Levaquin as prescribed.  Continue to monitor.  Patient already on guaifenesin. 2. Hypokalemia/hyponatremia.  Repleting with IV fluid.  Follow-up on a.m. labs. 3. Elevated troponin.  Continue to trend.  Patient has no history of CAD or chest pain. 4. Abnormal LFTs.  Continue to monitor on repeat labs.  Alcohol intake discontinued 12 years ago. 5. Anemia.  Anemia panel pending. 6. Hypertension.  Off medications for some time.  Continue to monitor.  Will need follow-up in the outpatient setting.    DVT prophylaxis:SCDs Code Status: Full Family Communication: None at bedside Disposition Plan: Continue Levaquin as well as breathing treatments and steroids until symptomatic improvement   Consultants:   None  Procedures:   None  Antimicrobials:   Levaquin 8/28->   Subjective: Patient seen and evaluated today with no new acute complaints or concerns. No acute concerns or events noted overnight.  He continues to have an  ongoing productive cough, but no shortness of breath.  Objective: Vitals:   09/21/17 0418 09/21/17 0500 09/21/17 0600 09/21/17 0800  BP:    131/72  Pulse:  (!) 105 91 98  Resp:  (!) 21 20 (!) 23  Temp:    (!) 97.5 F (36.4 C)  TempSrc:    Axillary  SpO2: 95% 96% 95% 92%  Weight:  79.8 kg    Height:        Intake/Output Summary (Last 24 hours) at 09/21/2017 1101 Last data filed at 09/21/2017 0600 Gross per 24 hour  Intake 2215.01 ml  Output -  Net 2215.01 ml   Filed Weights   09/20/17 1920 09/21/17 0211 09/21/17 0500  Weight: 87.1 kg 79.8 kg 79.8 kg    Examination:  General exam: Appears calm and comfortable  Respiratory system: Clear to auscultation. Respiratory effort normal. On 2L [place]. Cardiovascular system: S1 & S2 heard, RRR. No JVD, murmurs, rubs, gallops or clicks. No pedal edema. Gastrointestinal system: Abdomen is nondistended, soft and nontender. No organomegaly or masses felt. Normal bowel sounds heard. Central nervous system: Alert and oriented. No focal neurological deficits. Extremities: Symmetric 5 x 5 power. Skin: No rashes, lesions or ulcers Psychiatry: Judgement and insight appear normal. Mood & affect appropriate.     Data Reviewed: I have personally reviewed following labs and imaging studies  CBC: Recent Labs  Lab 09/20/17 1927 09/21/17 0422  WBC 7.1 5.8  NEUTROABS 5.7 4.6  HGB 12.6* 11.5*  HCT 35.4* 33.5*  MCV 83.5 84.8  PLT 216 187   Basic Metabolic Panel: Recent Labs  Lab 09/20/17 1927 09/21/17 0422  NA 129* 136  K 3.0* 3.5  CL 94* 101  CO2 26 25  GLUCOSE 107* 93  BUN 13 11  CREATININE 0.95 0.81  CALCIUM 8.0* 8.0*  MG 2.2  --    GFR: Estimated Creatinine Clearance: 105.6 mL/min (by C-G formula based on SCr of 0.81 mg/dL). Liver Function Tests: Recent Labs  Lab 09/20/17 1927 09/21/17 0422  AST 60* 47*  ALT 47* 37  ALKPHOS 161* 136*  BILITOT 0.8 0.5  PROT 7.1 6.3*  ALBUMIN 3.0* 2.7*   Recent Labs  Lab  09/20/17 1927  LIPASE 49   No results for input(s): AMMONIA in the last 168 hours. Coagulation Profile: Recent Labs  Lab 09/20/17 1927  INR 1.01   Cardiac Enzymes: Recent Labs  Lab 09/20/17 1927 09/20/17 2132  TROPONINI 0.03* 0.04*   BNP (last 3 results) No results for input(s): PROBNP in the last 8760 hours. HbA1C: No results for input(s): HGBA1C in the last 72 hours. CBG: No results for input(s): GLUCAP in the last 168 hours. Lipid Profile: No results for input(s): CHOL, HDL, LDLCALC, TRIG, CHOLHDL, LDLDIRECT in the last 72 hours. Thyroid Function Tests: No results for input(s): TSH, T4TOTAL, FREET4, T3FREE, THYROIDAB in the last 72 hours. Anemia Panel: No results for input(s): VITAMINB12, FOLATE, FERRITIN, TIBC, IRON, RETICCTPCT in the last 72 hours. Sepsis Labs: Recent Labs  Lab 09/20/17 2010 09/20/17 2132  LATICACIDVEN 1.0 0.9    Recent Results (from the past 240 hour(s))  Culture, blood (routine x 2)     Status: None (Preliminary result)   Collection Time: 09/20/17  9:04 PM  Result Value Ref Range Status   Specimen Description RIGHT ANTECUBITAL  Final   Special Requests   Final    BOTTLES DRAWN AEROBIC AND ANAEROBIC Blood Culture results may not be optimal due to an excessive volume of blood received in culture bottles   Culture   Final    NO GROWTH < 12 HOURS Performed at [facility], [address] [place], [place] [address]    Report Status PENDING  Incomplete  Culture, blood (routine x 2)     Status: None (Preliminary result)   Collection Time: 09/20/17  9:32 PM  Result Value Ref Range Status   Specimen Description BLOOD RIGHT HAND  Final   Special Requests   Final    BOTTLES DRAWN AEROBIC AND ANAEROBIC Blood Culture adequate volume   Culture   Final    NO GROWTH < 12 HOURS Performed at [facility], [address], [place], [place] [address]    Report Status PENDING  Incomplete  MRSA PCR Screening     Status: None   Collection Time: 09/21/17   1:57 AM  Result Value Ref Range Status   MRSA by PCR NEGATIVE NEGATIVE Final    Comment:        The GeneXpert MRSA Assay (FDA approved for NASAL specimens only), is one component of a comprehensive MRSA colonization surveillance program. It is not intended to diagnose MRSA infection nor to guide or monitor treatment for MRSA infections. Performed at [facility], [address] [place], [place] [address]          Radiology Studies: Dg Chest 2 View  Result Date: 09/20/2017 CLINICAL DATA:  Cough EXAM: CHEST - 2 VIEW COMPARISON:  None. FINDINGS: Mild interstitial prominence and peribronchial thickening. Heart and mediastinal contours are within normal limits. No focal opacities or effusions. No acute bony abnormality. IMPRESSION: Peribronchial thickening  and interstitial prominence, likely bronchitic changes. Electronically Signed   By: Charlett Nose M.D.   On: 09/20/2017 20:18        Scheduled Meds: . ipratropium-albuterol  3 mL Nebulization Q6H  . methylPREDNISolone (SOLU-MEDROL) injection  40 mg Intravenous Q6H   Continuous Infusions: . 0.9 % NaCl with KCl 20 mEq / L 125 mL/hr at 09/21/17 0341  . levofloxacin (LEVAQUIN) IV Stopped (09/21/17 0556)     LOS: 0 days    Time spent: 30 minutes    Pratik Hoover Brunette, DO [facility] Pager [phone]  If 7PM-7AM, please contact night-coverage [email or web address] Password [email or web address] 09/21/2017, 11:01 AM

## 2017-09-21 NOTE — Progress Notes (Signed)
Report given to 300 nurse patient taken to room via wheelchair.

## 2017-09-22 DIAGNOSIS — K529 Noninfective gastroenteritis and colitis, unspecified: Principal | ICD-10-CM

## 2017-09-22 LAB — CBC
HCT: 36.8 % — ABNORMAL LOW (ref 39.0–52.0)
Hemoglobin: 12.7 g/dL — ABNORMAL LOW (ref 13.0–17.0)
MCH: 29.3 pg (ref 26.0–34.0)
MCHC: 34.5 g/dL (ref 30.0–36.0)
MCV: 84.8 fL (ref 78.0–100.0)
PLATELETS: 321 10*3/uL (ref 150–400)
RBC: 4.34 MIL/uL (ref 4.22–5.81)
RDW: 12.9 % (ref 11.5–15.5)
WBC: 13.8 10*3/uL — AB (ref 4.0–10.5)

## 2017-09-22 LAB — COMPREHENSIVE METABOLIC PANEL
ALT: 44 U/L (ref 0–44)
AST: 50 U/L — AB (ref 15–41)
Albumin: 3 g/dL — ABNORMAL LOW (ref 3.5–5.0)
Alkaline Phosphatase: 135 U/L — ABNORMAL HIGH (ref 38–126)
Anion gap: 11 (ref 5–15)
BILIRUBIN TOTAL: 0.6 mg/dL (ref 0.3–1.2)
BUN: 11 mg/dL (ref 6–20)
CO2: 24 mmol/L (ref 22–32)
CREATININE: 0.82 mg/dL (ref 0.61–1.24)
Calcium: 8.9 mg/dL (ref 8.9–10.3)
Chloride: 105 mmol/L (ref 98–111)
GFR calc Af Amer: >60 mL/min (ref >60)
GLUCOSE: 120 mg/dL — AB (ref 70–99)
Potassium: 3.3 mmol/L — ABNORMAL LOW (ref 3.5–5.1)
Sodium: 140 mmol/L (ref 135–145)
TOTAL PROTEIN: 7.2 g/dL (ref 6.5–8.1)

## 2017-09-22 LAB — URINE CULTURE: Culture: NO GROWTH

## 2017-09-22 MED ORDER — POTASSIUM CHLORIDE CRYS ER 20 MEQ PO TBCR
40.0000 meq | EXTENDED_RELEASE_TABLET | Freq: Once | ORAL | Status: AC
  Administered 2017-09-22: 40 meq via ORAL
  Filled 2017-09-22: qty 2

## 2017-09-22 MED ORDER — PREDNISONE 20 MG PO TABS: 40.0000 mg | ORAL_TABLET | Freq: Every day | ORAL | 0 refills | Status: AC

## 2017-09-22 MED ORDER — LISINOPRIL 20 MG PO TABS: 20.0000 mg | ORAL_TABLET | Freq: Every day | ORAL | 2 refills | Status: AC

## 2017-09-22 MED ORDER — GUAIFENESIN 100 MG/5ML PO SOLN: 5.0000 mL | ORAL | 0 refills | Status: AC | PRN

## 2017-09-22 MED ORDER — LEVOFLOXACIN 750 MG PO TABS: 750.0000 mg | ORAL_TABLET | Freq: Every day | ORAL | 0 refills | Status: AC

## 2017-09-22 MED ORDER — ALBUTEROL SULFATE HFA 108 (90 BASE) MCG/ACT IN AERS: 2.0000 | INHALATION_SPRAY | Freq: Four times a day (QID) | RESPIRATORY_TRACT | 2 refills | Status: AC | PRN

## 2017-09-22 NOTE — Care Management (Signed)
Pt given list of M[employer] voucher for med assistance. Pt given list of [facility] resources and PCP providers that accept pt's with no insurance.

## 2017-09-22 NOTE — Progress Notes (Signed)
Patient is to be discharged home and in stable condition. Patient's IV removed, WNL. Patient given discharge instructions and verbalized understanding. Patient denies the need for a wheelchair escort out, requesting to ambulate upon the arrival of his transportation.  Quita SkyeMorgan P Dishmon, RN

## 2017-09-22 NOTE — Discharge Summary (Signed)
Physician Discharge Summary  Ryan Finley ZOX:[id number] DOB: [birth year] DOA: 09/20/2017  PCP: Patient, No Pcp Per  Admit date: 09/20/2017  Discharge date: 09/22/2017  Admitted From:Home  Disposition:  Home  Recommendations for Outpatient Follow-up:  1. Follow up with new PCP in 1-2 weeks  Home Health:N/A  Equipment/Devices:N/A  Discharge Condition:Stable  CODE STATUS: Full  Diet recommendation: Heart Healthy  Brief/Interim Summary:  Ryan Finley a 57 y.o.malewith medical history significant ofGERD and hypertension who is coming to the emergency department with complaints of cough, congestion, fever,cough induced nausea and emesisfor several days.The patient also had several episodes of diarrhea on Sunday. He denies sick contacts or travel history. He has been having night sweats and low-grade fevers.  He had been admitted with acute bronchitis and possible gastroenteritis.  He had been started on IV fluids as well as bronchodilators, Solu-Medrol, and Levaquin with vast improvement.  He states that he feels that he is back to his usual baseline level of functioning and will be prescribed 5 more day course of Levaquin as well as albuterol inhaler at home to use as needed.  He will also remain on prednisone for 5 more days as well as Robitussin as needed.  He is also been given a refill on his home lisinopril medication.  He does not currently have a PCP and therefore, will be given a list of PCPs to follow-up with prior to discharge.  Discharge Diagnoses:  Principal Problem:   Acute gastroenteritis Active Problems:   Hypertension   Hypokalemia   Hyponatremia   Elevated troponin   Abnormal liver function tests   Anemia   Acute bronchitis    Discharge Instructions  Discharge Instructions    Diet - low sodium heart healthy   Complete by:  As directed    Increase activity slowly   Complete by:  As directed      Allergies as of 09/22/2017      Reactions   Celexa [citalopram] Other (See Comments)   Mental processing issues      Medication List    TAKE these medications   albuterol 108 (90 Base) MCG/ACT inhaler Commonly known as:  PROVENTIL HFA;VENTOLIN HFA Inhale 2 puffs into the lungs every 6 (six) hours as needed for wheezing or shortness of breath.   guaiFENesin 100 MG/5ML Soln Commonly known as:  ROBITUSSIN Take 5 mLs (100 mg total) by mouth every 4 (four) hours as needed for cough or to loosen phlegm.   levofloxacin 750 MG tablet Commonly known as:  LEVAQUIN Take 1 tablet (750 mg total) by mouth daily for 5 days.   lisinopril 20 MG tablet Commonly known as:  PRINIVIL,ZESTRIL Take 1 tablet (20 mg total) by mouth daily.   omeprazole 20 MG capsule Commonly known as:  PRILOSEC Take 20 mg by mouth daily.   predniSONE 20 MG tablet Commonly known as:  DELTASONE Take 2 tablets (40 mg total) by mouth daily for 5 days.      Follow-up Information    pcp Follow up in 2 week(s).   Why:  please give list of pcp to follow up with prior to DC         Allergies  Allergen Reactions  . Celexa [Citalopram] Other (See Comments)    Mental processing issues    Consultations:  None   Procedures/Studies: Dg Chest 2 View  Result Date: 09/20/2017 CLINICAL DATA:  Cough EXAM: CHEST - 2 VIEW COMPARISON:  None. FINDINGS: Mild interstitial prominence and peribronchial thickening.  Heart and mediastinal contours are within normal limits. No focal opacities or effusions. No acute bony abnormality. IMPRESSION: Peribronchial thickening and interstitial prominence, likely bronchitic changes. Electronically Signed   By: Charlett Nose M.D.   On: 09/20/2017 20:18     Discharge Exam: Vitals:   09/22/17 0556 09/22/17 0900  BP: (!) 143/95   Pulse: 90   Resp: 16   Temp: (!) 97.5 F (36.4 C)   SpO2: 96% 94%   Vitals:   09/21/17 1620 09/21/17 2106 09/22/17 0556 09/22/17 0900  BP: (!) 145/84  (!) 143/95   Pulse: (!) 102  90   Resp: 20  16    Temp: 97.6 F (36.4 C)  (!) 97.5 F (36.4 C)   TempSrc: Oral  Oral   SpO2: 92% 92% 96% 94%  Weight:      Height:        General: Pt is alert, awake, not in acute distress Cardiovascular: RRR, S1/S2 +, no rubs, no gallops Respiratory: CTA bilaterally, no wheezing, no rhonchi Abdominal: Soft, NT, ND, bowel sounds + Extremities: no edema, no cyanosis    The results of significant diagnostics from this hospitalization (including imaging, microbiology, ancillary and laboratory) are listed below for reference.     Microbiology: Recent Results (from the past 240 hour(s))  Culture, blood (routine x 2)     Status: None (Preliminary result)   Collection Time: 09/20/17  9:04 PM  Result Value Ref Range Status   Specimen Description RIGHT ANTECUBITAL  Final   Special Requests   Final    BOTTLES DRAWN AEROBIC AND ANAEROBIC Blood Culture results may not be optimal due to an excessive volume of blood received in culture bottles   Culture   Final    NO GROWTH 2 DAYS Performed at [facility], [address] [place], [place] [address]    Report Status PENDING  Incomplete  Culture, blood (routine x 2)     Status: None (Preliminary result)   Collection Time: 09/20/17  9:32 PM  Result Value Ref Range Status   Specimen Description BLOOD RIGHT HAND  Final   Special Requests   Final    BOTTLES DRAWN AEROBIC AND ANAEROBIC Blood Culture adequate volume   Culture   Final    NO GROWTH 2 DAYS Performed at [facility], [address] [place], [place] [address]    Report Status PENDING  Incomplete  MRSA PCR Screening     Status: None   Collection Time: 09/21/17  1:57 AM  Result Value Ref Range Status   MRSA by PCR NEGATIVE NEGATIVE Final    Comment:        The GeneXpert MRSA Assay (FDA approved for NASAL specimens only), is one component of a comprehensive MRSA colonization surveillance program. It is not intended to diagnose MRSA infection nor to guide or monitor treatment for MRSA  infections. Performed at [facility], [address], [place], [place] [address]   Expectorated sputum assessment w rflx to resp cult     Status: None   Collection Time: 09/21/17  3:26 PM  Result Value Ref Range Status   Specimen Description EXPECTORATED SPUTUM  Final   Special Requests NONE  Final   Sputum evaluation   Final    Sputum specimen not acceptable for testing.  Please recollect.   RESULT CALLED TO AND VERIFIED WITH J. MAYS ON 09/21/17 AT 1655 BY LOY,C Performed at [facility], [address] [place], [place] [address]    Report Status  09/21/2017 FINAL  Final     Labs: BNP (last 3 results) Recent Labs    09/20/17 1927  BNP 45.0   Basic Metabolic Panel: Recent Labs  Lab 09/20/17 1927 09/21/17 0422 09/22/17 0647  NA 129* 136 140  K 3.0* 3.5 3.3*  CL 94* 101 105  CO2 26 25 24   GLUCOSE 107* 93 120*  BUN 13 11 11   CREATININE 0.95 0.81 0.82  CALCIUM 8.0* 8.0* 8.9  MG 2.2  --   --    Liver Function Tests: Recent Labs  Lab 09/20/17 1927 09/21/17 0422 09/22/17 0647  AST 60* 47* 50*  ALT 47* 37 44  ALKPHOS 161* 136* 135*  BILITOT 0.8 0.5 0.6  PROT 7.1 6.3* 7.2  ALBUMIN 3.0* 2.7* 3.0*   Recent Labs  Lab 09/20/17 1927  LIPASE 49   No results for input(s): AMMONIA in the last 168 hours. CBC: Recent Labs  Lab 09/20/17 1927 09/21/17 0422 09/22/17 0647  WBC 7.1 5.8 13.8*  NEUTROABS 5.7 4.6  --   HGB 12.6* 11.5* 12.7*  HCT 35.4* 33.5* 36.8*  MCV 83.5 84.8 84.8  PLT 216 187 321   Cardiac Enzymes: Recent Labs  Lab 09/20/17 1927 09/20/17 2132  TROPONINI 0.03* 0.04*   BNP: Invalid input(s): POCBNP CBG: No results for input(s): GLUCAP in the last 168 hours. D-Dimer No results for input(s): DDIMER in the last 72 hours. Hgb A1c No results for input(s): HGBA1C in the last 72 hours. Lipid Profile No results for input(s): CHOL, HDL, LDLCALC, TRIG, CHOLHDL, LDLDIRECT in the last 72 hours. Thyroid function studies No results for input(s): TSH,  T4TOTAL, T3FREE, THYROIDAB in the last 72 hours.  Invalid input(s): FREET3 Anemia work up No results for input(s): VITAMINB12, FOLATE, FERRITIN, TIBC, IRON, RETICCTPCT in the last 72 hours. Urinalysis    Component Value Date/Time   COLORURINE YELLOW 09/20/2017 2001   APPEARANCEUR CLEAR 09/20/2017 2001   LABSPEC 1.012 09/20/2017 2001   PHURINE 6.0 09/20/2017 2001   GLUCOSEU NEGATIVE 09/20/2017 2001   HGBUR MODERATE (A) 09/20/2017 2001   BILIRUBINUR NEGATIVE 09/20/2017 2001   KETONESUR 5 (A) 09/20/2017 2001   PROTEINUR 30 (A) 09/20/2017 2001   NITRITE NEGATIVE 09/20/2017 2001   LEUKOCYTESUR NEGATIVE 09/20/2017 2001   Sepsis Labs Invalid input(s): PROCALCITONIN,  WBC,  LACTICIDVEN Microbiology Recent Results (from the past 240 hour(s))  Culture, blood (routine x 2)     Status: None (Preliminary result)   Collection Time: 09/20/17  9:04 PM  Result Value Ref Range Status   Specimen Description RIGHT ANTECUBITAL  Final   Special Requests   Final    BOTTLES DRAWN AEROBIC AND ANAEROBIC Blood Culture results may not be optimal due to an excessive volume of blood received in culture bottles   Culture   Final    NO GROWTH 2 DAYS Performed at [facility], [address] [place], [place] [address]    Report Status PENDING  Incomplete  Culture, blood (routine x 2)     Status: None (Preliminary result)   Collection Time: 09/20/17  9:32 PM  Result Value Ref Range Status   Specimen Description BLOOD RIGHT HAND  Final   Special Requests   Final    BOTTLES DRAWN AEROBIC AND ANAEROBIC Blood Culture adequate volume   Culture   Final    NO GROWTH 2 DAYS Performed at [facility], [address] [place], [place] [address]    Report Status PENDING  Incomplete  MRSA PCR Screening  Status: None   Collection Time: 09/21/17  1:57 AM  Result Value Ref Range Status   MRSA by PCR NEGATIVE NEGATIVE Final    Comment:        The GeneXpert MRSA Assay (FDA approved for NASAL specimens only), is  one component of a comprehensive MRSA colonization surveillance program. It is not intended to diagnose MRSA infection nor to guide or monitor treatment for MRSA infections. Performed at [facility], [address] [place], [place] [address]   Expectorated sputum assessment w rflx to resp cult     Status: None   Collection Time: 09/21/17  3:26 PM  Result Value Ref Range Status   Specimen Description EXPECTORATED SPUTUM  Final   Special Requests NONE  Final   Sputum evaluation   Final    Sputum specimen not acceptable for testing.  Please recollect.   RESULT CALLED TO AND VERIFIED WITH J. MAYS ON 09/21/17 AT 1655 BY LOY,C Performed at [facility], [address] [place], [place] [address]    Report Status 09/21/2017 FINAL  Final     Time coordinating discharge: 35 minutes  SIGNED:   Erick BlinksPratik D Shah, DO [facility] 09/22/2017, 9:18 AM Pager [phone]  If 7PM-7AM, please contact night-coverage [email or web address] Password [email or web address]

## 2017-09-25 LAB — CULTURE, BLOOD (ROUTINE X 2)
CULTURE: NO GROWTH
CULTURE: NO GROWTH
SPECIAL REQUESTS: ADEQUATE

## 2017-11-15 ENCOUNTER — Emergency Department (HOSPITAL_COMMUNITY)
Admission: EM | Admit: 2017-11-15 | Discharge: 2017-11-15 | Disposition: A | Discharge status: Home / Self Care | Payer: No Typology Code available for payment source | Attending: Emergency Medicine | Admitting: Emergency Medicine

## 2017-11-15 ENCOUNTER — Emergency Department (HOSPITAL_COMMUNITY): Payer: No Typology Code available for payment source

## 2017-11-15 ENCOUNTER — Encounter (HOSPITAL_COMMUNITY): Payer: Self-pay

## 2017-11-15 ENCOUNTER — Other Ambulatory Visit: Payer: Self-pay

## 2017-11-15 DIAGNOSIS — Y9389 Activity, other specified: Secondary | ICD-10-CM | POA: Diagnosis not present

## 2017-11-15 DIAGNOSIS — S52614A Nondisplaced fracture of right ulna styloid process, initial encounter for closed fracture: Secondary | ICD-10-CM

## 2017-11-15 DIAGNOSIS — Y999 Unspecified external cause status: Secondary | ICD-10-CM | POA: Insufficient documentation

## 2017-11-15 DIAGNOSIS — S8391XA Sprain of unspecified site of right knee, initial encounter: Secondary | ICD-10-CM | POA: Insufficient documentation

## 2017-11-15 DIAGNOSIS — I10 Essential (primary) hypertension: Secondary | ICD-10-CM | POA: Diagnosis not present

## 2017-11-15 DIAGNOSIS — Y92410 Unspecified street and highway as the place of occurrence of the external cause: Secondary | ICD-10-CM | POA: Insufficient documentation

## 2017-11-15 DIAGNOSIS — S59911A Unspecified injury of right forearm, initial encounter: Secondary | ICD-10-CM | POA: Diagnosis present

## 2017-11-15 DIAGNOSIS — Z87891 Personal history of nicotine dependence: Secondary | ICD-10-CM | POA: Insufficient documentation

## 2017-11-15 DIAGNOSIS — S0990XA Unspecified injury of head, initial encounter: Secondary | ICD-10-CM | POA: Diagnosis not present

## 2017-11-15 DIAGNOSIS — S86911A Strain of unspecified muscle(s) and tendon(s) at lower leg level, right leg, initial encounter: Secondary | ICD-10-CM

## 2017-11-15 MED ORDER — SODIUM CHLORIDE 0.9 % IV BOLUS
1000.0000 mL | Freq: Once | INTRAVENOUS | Status: AC
  Administered 2017-11-15: 1000 mL via INTRAVENOUS

## 2017-11-15 MED ORDER — ONDANSETRON HCL 4 MG/2ML IJ SOLN
4.0000 mg | Freq: Once | INTRAMUSCULAR | Status: AC
  Administered 2017-11-15: 4 mg via INTRAVENOUS
  Filled 2017-11-15: qty 2

## 2017-11-15 MED ORDER — IBUPROFEN 600 MG PO TABS: 600.0000 mg | ORAL_TABLET | Freq: Four times a day (QID) | ORAL | 0 refills | Status: AC | PRN

## 2017-11-15 MED ORDER — HYDROCODONE-ACETAMINOPHEN 5-325 MG PO TABS: 1.0000 | ORAL_TABLET | ORAL | 0 refills | Status: AC | PRN

## 2017-11-15 MED ORDER — MORPHINE SULFATE (PF) 4 MG/ML IV SOLN
4.0000 mg | Freq: Once | INTRAVENOUS | Status: AC
  Administered 2017-11-15: 4 mg via INTRAVENOUS
  Filled 2017-11-15: qty 1
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
## 2017-11-17 ENCOUNTER — Telehealth: Payer: Self-pay | Admitting: Orthopedic Surgery
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
